# Patient Record
Sex: Male | Born: 1960 | Race: White | Hispanic: No | Marital: Married | Smoking: Current some day smoker
Health system: Southern US, Community
[De-identification: ages and names within clinical notes are randomized; demographics above are authoritative.]

## PROBLEM LIST (undated history)

## (undated) DIAGNOSIS — E785 Hyperlipidemia, unspecified: Secondary | ICD-10-CM

## (undated) DIAGNOSIS — R001 Bradycardia, unspecified: Secondary | ICD-10-CM

## (undated) DIAGNOSIS — G8929 Other chronic pain: Secondary | ICD-10-CM

## (undated) DIAGNOSIS — J449 Chronic obstructive pulmonary disease, unspecified: Secondary | ICD-10-CM

## (undated) DIAGNOSIS — R0602 Shortness of breath: Secondary | ICD-10-CM

## (undated) DIAGNOSIS — I10 Essential (primary) hypertension: Secondary | ICD-10-CM

## (undated) DIAGNOSIS — I498 Other specified cardiac arrhythmias: Secondary | ICD-10-CM

## (undated) DIAGNOSIS — R011 Cardiac murmur, unspecified: Secondary | ICD-10-CM

## (undated) DIAGNOSIS — M1990 Unspecified osteoarthritis, unspecified site: Secondary | ICD-10-CM

## (undated) DIAGNOSIS — F419 Anxiety disorder, unspecified: Secondary | ICD-10-CM

## (undated) DIAGNOSIS — M545 Low back pain: Secondary | ICD-10-CM

## (undated) DIAGNOSIS — I2510 Atherosclerotic heart disease of native coronary artery without angina pectoris: Secondary | ICD-10-CM

## (undated) DIAGNOSIS — K255 Chronic or unspecified gastric ulcer with perforation: Secondary | ICD-10-CM

## (undated) DIAGNOSIS — F329 Major depressive disorder, single episode, unspecified: Secondary | ICD-10-CM

## (undated) HISTORY — DX: Chronic obstructive pulmonary disease, unspecified: J44.9

## (undated) HISTORY — DX: Other specified cardiac arrhythmias: I49.8

## (undated) HISTORY — PX: HERNIA REPAIR: SHX51

## (undated) HISTORY — DX: Low back pain: M54.5

## (undated) HISTORY — DX: Other chronic pain: G89.29

## (undated) HISTORY — DX: Bradycardia, unspecified: R00.1

## (undated) HISTORY — DX: Shortness of breath: R06.02

## (undated) HISTORY — DX: Atherosclerotic heart disease of native coronary artery without angina pectoris: I25.10

## (undated) HISTORY — DX: Major depressive disorder, single episode, unspecified: F32.9

## (undated) HISTORY — DX: Hyperlipidemia, unspecified: E78.5

## (undated) HISTORY — DX: Anxiety disorder, unspecified: F41.9

## (undated) HISTORY — PX: VARICOSE VEIN SURGERY: SHX832

## (undated) HISTORY — DX: Essential (primary) hypertension: I10

## (undated) HISTORY — PX: KNEE SURGERY: SHX244

## (undated) HISTORY — DX: Cardiac murmur, unspecified: R01.1

## (undated) HISTORY — DX: Unspecified osteoarthritis, unspecified site: M19.90

## (undated) HISTORY — DX: Chronic or unspecified gastric ulcer with perforation: K25.5

## (undated) HISTORY — PX: TONSILLECTOMY: SUR1361

---

## 1999-01-27 ENCOUNTER — Encounter: Payer: Self-pay | Admitting: Orthopedic Surgery

## 1999-01-27 ENCOUNTER — Ambulatory Visit (HOSPITAL_COMMUNITY): Admission: RE | Admit: 1999-01-27 | Discharge: 1999-01-27 | Payer: Self-pay | Admitting: Orthopedic Surgery

## 1999-02-08 ENCOUNTER — Encounter: Admission: RE | Admit: 1999-02-08 | Discharge: 1999-02-08 | Payer: Self-pay | Admitting: Orthopedic Surgery

## 1999-02-08 ENCOUNTER — Encounter: Payer: Self-pay | Admitting: Orthopedic Surgery

## 1999-02-09 ENCOUNTER — Ambulatory Visit (HOSPITAL_BASED_OUTPATIENT_CLINIC_OR_DEPARTMENT_OTHER): Admission: RE | Admit: 1999-02-09 | Discharge: 1999-02-10 | Payer: Self-pay | Admitting: Internal Medicine

## 2000-03-11 DIAGNOSIS — K255 Chronic or unspecified gastric ulcer with perforation: Secondary | ICD-10-CM

## 2000-03-11 HISTORY — DX: Chronic or unspecified gastric ulcer with perforation: K25.5

## 2008-06-09 DIAGNOSIS — I2510 Atherosclerotic heart disease of native coronary artery without angina pectoris: Secondary | ICD-10-CM

## 2008-06-09 HISTORY — DX: Atherosclerotic heart disease of native coronary artery without angina pectoris: I25.10

## 2008-06-09 HISTORY — PX: CARDIAC CATHETERIZATION: SHX172

## 2009-12-04 ENCOUNTER — Ambulatory Visit: Payer: Self-pay | Admitting: Cardiovascular Disease

## 2009-12-06 ENCOUNTER — Telehealth: Payer: Self-pay | Admitting: Cardiovascular Disease

## 2009-12-08 ENCOUNTER — Telehealth: Payer: Self-pay | Admitting: Cardiovascular Disease

## 2009-12-11 ENCOUNTER — Encounter: Payer: Self-pay | Admitting: Cardiovascular Disease

## 2009-12-13 ENCOUNTER — Telehealth: Payer: Self-pay | Admitting: Cardiovascular Disease

## 2009-12-22 ENCOUNTER — Ambulatory Visit: Payer: Self-pay | Admitting: Cardiovascular Disease

## 2010-03-26 ENCOUNTER — Ambulatory Visit: Admit: 2010-03-26 | Payer: Self-pay | Admitting: Cardiovascular Disease

## 2010-04-09 ENCOUNTER — Ambulatory Visit
Admission: RE | Admit: 2010-04-09 | Discharge: 2010-04-09 | Payer: Self-pay | Source: Home / Self Care | Attending: Cardiovascular Disease | Admitting: Cardiovascular Disease

## 2010-04-10 NOTE — Procedures (Signed)
Summary: summary report  summary report   Imported By: Mirna Mires 12/13/2009 12:38:12  _____________________________________________________________________  External Attachment:    Type:   Image     Comment:   External Document

## 2010-04-10 NOTE — Progress Notes (Signed)
  Phone Note From Other Clinic   Caller: [facility] Summary of Call: CALL FROM [facility] WITH [place] [facility]/ PATIENT AT [facility] FOR GEST/ECHO TODAY.  UNABLE TO STRESS PATIENT-HR IN THE 40'S AND PATIENT C/O BACK PAIN(CHRONIC).  SPOKE WITH DR. Kirke Corin WHO ADVISED JUST TO DO ECHO.  PATIENT DOES NOT NEED NUCLEAR STUDY.   Initial call taken by: Dessie Coma  LPN,  December 06, 2009 10:25 AM

## 2010-04-10 NOTE — Progress Notes (Signed)
  Phone Note Outgoing Call   Call placed by: Dessie Coma  LPN,  December 13, 2009 9:57 AM Call placed to: Patient Summary of Call: Patient notified of results of monitor per Dr Kirke Corin.  Patient has f/u appt on Friday 12/22/09.  Advised to keep f/u.

## 2010-04-10 NOTE — Progress Notes (Signed)
  Phone Note Outgoing Call   Call placed by: Dessie Coma  LPN,  December 08, 2009 1:41 PM Call placed to: Patient Summary of Call: Patient notified per Dr. Kirke Corin, Echo was OK.  Patient has f/u on 12/12/09.

## 2010-04-16 ENCOUNTER — Encounter: Payer: Self-pay | Admitting: Physician Assistant

## 2010-04-23 DIAGNOSIS — R635 Abnormal weight gain: Secondary | ICD-10-CM | POA: Insufficient documentation

## 2010-04-23 DIAGNOSIS — I498 Other specified cardiac arrhythmias: Secondary | ICD-10-CM | POA: Insufficient documentation

## 2010-04-23 DIAGNOSIS — I2510 Atherosclerotic heart disease of native coronary artery without angina pectoris: Secondary | ICD-10-CM | POA: Insufficient documentation

## 2010-04-23 DIAGNOSIS — R079 Chest pain, unspecified: Secondary | ICD-10-CM

## 2010-04-23 DIAGNOSIS — J449 Chronic obstructive pulmonary disease, unspecified: Secondary | ICD-10-CM

## 2010-04-23 DIAGNOSIS — J4489 Other specified chronic obstructive pulmonary disease: Secondary | ICD-10-CM | POA: Insufficient documentation

## 2010-04-23 DIAGNOSIS — I499 Cardiac arrhythmia, unspecified: Secondary | ICD-10-CM

## 2010-04-23 DIAGNOSIS — I10 Essential (primary) hypertension: Secondary | ICD-10-CM

## 2010-04-23 DIAGNOSIS — Z87891 Personal history of nicotine dependence: Secondary | ICD-10-CM

## 2010-04-23 HISTORY — DX: Other specified chronic obstructive pulmonary disease: J44.89

## 2010-04-23 HISTORY — DX: Other specified cardiac arrhythmias: I49.8

## 2010-04-23 HISTORY — DX: Chronic obstructive pulmonary disease, unspecified: J44.9

## 2010-04-23 HISTORY — DX: Atherosclerotic heart disease of native coronary artery without angina pectoris: I25.10

## 2010-04-23 HISTORY — DX: Essential (primary) hypertension: I10

## 2010-04-23 HISTORY — DX: Personal history of nicotine dependence: Z87.891

## 2010-04-23 HISTORY — DX: Cardiac arrhythmia, unspecified: I49.9

## 2010-04-23 HISTORY — DX: Chest pain, unspecified: R07.9

## 2010-04-23 NOTE — Assessment & Plan Note (Signed)
[facility]                       [place] [facility]ARDIOLOGY OFFICE NOTE  ISAY, PERLEBERG                        MRN:          [id number] DATE:04/09/2010                            DOB:          [birth year]   [name] is a 50 year old gentleman who is here today for followup visit.  He has the following problem list: 1. COPD. 2. Chronic bradycardia. 3. Nonobstructive coronary artery disease per cardiac catheterization     in April 2010, which was done at [facility] by Dr. Warnell Forester.  There     was only 30% mid LAD stenosis at that time with normal ejection     fraction and left ventricular end-diastolic pressure. 4. Tobacco use, quit recently.  INTERVAL HISTORY:  [name] is still complaining of exertional chest pain, dyspnea and decreased exercise capacity.  He still describes chest tightness that happens with activities as well as dyspnea.  He does feel dizzy when he changes positions especially to standing up.  There has been no syncope.  He is not having as much palpitations as before.  MEDICATIONS: 1. Albuterol inhaler as needed. 2. Seroquel 50 mg 3 tablets at bedtime. 3. Lexapro 20 mg once daily. 4. Benazepril/hydrochlorothiazide 20/12.5 mg once daily. 5. Tamsulosin 0.4 mg once daily. 6. Omeprazole 20 mg twice daily. 7. Methadone 10 mg 4 tablets twice daily. 8. Clonazepam 0.5 mg twice daily. 9. Mirtazapine 15 mg once daily.  PHYSICAL EXAMINATION:  VITAL SIGNS:  Weight is 232.4 pounds, blood pressure is 99/62, pulse is 41, oxygen saturation is 96% on room air. NECK:  No JVD or carotid bruits. LUNGS:  Clear to auscultation. HEART:  Regular rate and rhythm with no gallops or murmurs. ABDOMEN:  Benign, nontender, nondistended. EXTREMITIES:  With no clubbing, cyanosis or edema.  An electrocardiogram was performed which showed sinus bradycardia with heart rate of 42 with nonspecific ST-T wave changes.  ASSESSMENT AND PLAN: 1. Exertional  chest pain and dyspnea:  The etiology of this is still     not clear.  He did have an echocardiogram done which showed normal     LV systolic function with only mild mitral regurgitation.  He had     cardiac catheterization done in 2010, which showed no evidence of     obstructive coronary artery disease.  He is still significantly     bradycardic.  This might be contributing to his symptoms.  Due to     that, I recommend proceeding with a treadmill stress test for     further evaluation.  The test may lead to evaluate his heart rate     response to exercise and see if he has evidence of chronotropic     incompetence.  If he does have that, then he might benefit from     placement of pacemaker.  This was discussed extensively with the     patient.  His thyroid function was checked and it was normal.  I do     not see anything in his medications that can cause significant     bradycardia. 2. Weight gain:  This seems to  be due to his recent smoking cessation.     I advised the patient to start an exercise program to reverse the     effect of that.  The patient will be notified with the results of     the treadmill stress test.  If it is significantly abnormal, we     will likely refer him to electrophysiology for evaluation of     pacemaker placement.    Lorine Bears, MD Electronically Signed   MA/MedQ  DD: 04/09/2010  DT: 04/10/2010  Job #: [id number]

## 2010-04-26 ENCOUNTER — Encounter: Payer: Self-pay | Admitting: Physician Assistant

## 2010-04-26 ENCOUNTER — Encounter (INDEPENDENT_AMBULATORY_CARE_PROVIDER_SITE_OTHER): Payer: Medicaid Other | Admitting: Physician Assistant

## 2010-04-26 DIAGNOSIS — R079 Chest pain, unspecified: Secondary | ICD-10-CM

## 2010-05-07 ENCOUNTER — Telehealth (INDEPENDENT_AMBULATORY_CARE_PROVIDER_SITE_OTHER): Payer: Self-pay | Admitting: Radiology

## 2010-05-08 ENCOUNTER — Ambulatory Visit (HOSPITAL_COMMUNITY): Payer: Medicaid Other

## 2010-05-08 DIAGNOSIS — R0989 Other specified symptoms and signs involving the circulatory and respiratory systems: Secondary | ICD-10-CM

## 2010-05-09 ENCOUNTER — Encounter: Payer: Self-pay | Admitting: Cardiology

## 2010-05-09 ENCOUNTER — Ambulatory Visit (HOSPITAL_COMMUNITY): Payer: Medicaid Other | Attending: Dermatopathology

## 2010-05-09 DIAGNOSIS — R079 Chest pain, unspecified: Secondary | ICD-10-CM | POA: Insufficient documentation

## 2010-05-09 DIAGNOSIS — I2510 Atherosclerotic heart disease of native coronary artery without angina pectoris: Secondary | ICD-10-CM

## 2010-05-09 DIAGNOSIS — R0989 Other specified symptoms and signs involving the circulatory and respiratory systems: Secondary | ICD-10-CM

## 2010-05-09 DIAGNOSIS — R0609 Other forms of dyspnea: Secondary | ICD-10-CM

## 2010-05-09 DIAGNOSIS — R0789 Other chest pain: Secondary | ICD-10-CM

## 2010-05-10 ENCOUNTER — Telehealth (INDEPENDENT_AMBULATORY_CARE_PROVIDER_SITE_OTHER): Payer: Self-pay | Admitting: *Deleted

## 2010-05-10 ENCOUNTER — Ambulatory Visit (INDEPENDENT_AMBULATORY_CARE_PROVIDER_SITE_OTHER): Payer: Medicaid Other | Admitting: Cardiovascular Disease

## 2010-05-10 ENCOUNTER — Ambulatory Visit: Payer: Medicaid Other | Admitting: Cardiovascular Disease

## 2010-05-10 DIAGNOSIS — R9430 Abnormal result of cardiovascular function study, unspecified: Secondary | ICD-10-CM

## 2010-05-10 DIAGNOSIS — I498 Other specified cardiac arrhythmias: Secondary | ICD-10-CM

## 2010-05-10 DIAGNOSIS — I209 Angina pectoris, unspecified: Secondary | ICD-10-CM

## 2010-05-10 HISTORY — PX: CARDIAC CATHETERIZATION: SHX172

## 2010-05-10 HISTORY — PX: CORONARY ANGIOPLASTY WITH STENT PLACEMENT: SHX49

## 2010-05-16 ENCOUNTER — Observation Stay (HOSPITAL_COMMUNITY)
Admission: RE | Admit: 2010-05-16 | Discharge: 2010-05-17 | Disposition: A | Discharge status: Home / Self Care | Payer: Medicaid Other | Source: Ambulatory Visit | Attending: Cardiovascular Disease | Admitting: Cardiovascular Disease

## 2010-05-16 DIAGNOSIS — Z79899 Other long term (current) drug therapy: Secondary | ICD-10-CM | POA: Insufficient documentation

## 2010-05-16 DIAGNOSIS — Z87891 Personal history of nicotine dependence: Secondary | ICD-10-CM | POA: Insufficient documentation

## 2010-05-16 DIAGNOSIS — Z23 Encounter for immunization: Secondary | ICD-10-CM | POA: Insufficient documentation

## 2010-05-16 DIAGNOSIS — I2510 Atherosclerotic heart disease of native coronary artery without angina pectoris: Principal | ICD-10-CM | POA: Insufficient documentation

## 2010-05-16 DIAGNOSIS — Z8711 Personal history of peptic ulcer disease: Secondary | ICD-10-CM | POA: Insufficient documentation

## 2010-05-16 DIAGNOSIS — I498 Other specified cardiac arrhythmias: Secondary | ICD-10-CM | POA: Insufficient documentation

## 2010-05-16 DIAGNOSIS — J4489 Other specified chronic obstructive pulmonary disease: Secondary | ICD-10-CM | POA: Insufficient documentation

## 2010-05-16 DIAGNOSIS — Z7982 Long term (current) use of aspirin: Secondary | ICD-10-CM | POA: Insufficient documentation

## 2010-05-16 DIAGNOSIS — J449 Chronic obstructive pulmonary disease, unspecified: Secondary | ICD-10-CM | POA: Insufficient documentation

## 2010-05-16 LAB — BASIC METABOLIC PANEL
BUN: 16 mg/dL (ref 6–23)
BUN: 16 mg/dL (ref 6–23)
CO2: 30 mEq/L (ref 19–32)
CO2: 26 mEq/L (ref 19–32)
Calcium: 9.1 mg/dL (ref 8.4–10.5)
Calcium: 8.6 mg/dL (ref 8.4–10.5)
Chloride: 98 mEq/L (ref 96–112)
Chloride: 107 mEq/L (ref 96–112)
Creatinine, Ser: 1.27 mg/dL (ref 0.4–1.5)
Creatinine, Ser: 1.29 mg/dL (ref 0.4–1.5)
GFR calc Af Amer: >60 mL/min (ref >60)
GFR calc Af Amer: >60 mL/min (ref >60)
GFR calc non Af Amer: >60 mL/min (ref >60)
GFR calc non Af Amer: 59 mL/min — ABNORMAL LOW (ref >60)
Glucose, Bld: 90 mg/dL (ref 70–99)
Glucose, Bld: 78 mg/dL (ref 70–99)
Potassium: 4.5 mEq/L (ref 3.5–5.1)
Potassium: 4.1 mEq/L (ref 3.5–5.1)
Sodium: 137 mEq/L (ref 135–145)
Sodium: 138 mEq/L (ref 135–145)

## 2010-05-16 LAB — CBC
HCT: 42.9 % (ref 39.0–52.0)
Hemoglobin: 14.7 g/dL (ref 13.0–17.0)
MCH: 31.1 pg (ref 26.0–34.0)
MCHC: 34.3 g/dL (ref 30.0–36.0)
MCV: 90.7 fL (ref 78.0–100.0)
Platelets: 131 10*3/uL — ABNORMAL LOW (ref 150–400)
RBC: 4.73 MIL/uL (ref 4.22–5.81)
RDW: 13 % (ref 11.5–15.5)
WBC: 5.8 10*3/uL (ref 4.0–10.5)

## 2010-05-16 LAB — PROTIME-INR
INR: 0.82 (ref 0.00–1.49)
Prothrombin Time: 11.5 seconds — ABNORMAL LOW (ref 11.6–15.2)

## 2010-05-16 LAB — POCT ACTIVATED CLOTTING TIME: Activated Clotting Time: 599 seconds

## 2010-05-17 LAB — CBC
HCT: 44 % (ref 39.0–52.0)
Platelets: 130 10*3/uL — ABNORMAL LOW (ref 150–400)
RBC: 4.82 MIL/uL (ref 4.22–5.81)
RDW: 13 % (ref 11.5–15.5)
WBC: 6.5 10*3/uL (ref 4.0–10.5)

## 2010-05-17 LAB — BASIC METABOLIC PANEL
Chloride: 99 mEq/L (ref 96–112)
GFR calc non Af Amer: 57 mL/min — ABNORMAL LOW (ref >60)
Glucose, Bld: 99 mg/dL (ref 70–99)
Potassium: 4.8 mEq/L (ref 3.5–5.1)
Sodium: 139 mEq/L (ref 135–145)

## 2010-05-17 NOTE — Progress Notes (Signed)
Summary: nuc pre-procedure  Phone Note Outgoing Call   Call placed by: Domenic Polite, CNMT,  May 07, 2010 2:43 PM Call placed to: Patient Reason for Call: Confirm/change Appt Summary of Call: Reviewed information on [employer] Information Sheet (see scanned document for further details).  Spoke with patient.      Nuclear Med Background Indications for Stress Test: Evaluation for Ischemia   History: COPD, Echo, GXT, Heart Catheterization  History Comments: Echo NL. mild mitral-tricuspid regurgitation; '10 Cath - [facility] 30% LAD stenosis; 04/26/10 GXT non-diagnostic  Symptoms: Chest Pain with Exertion, Chest Tightness with Exertion, Dizziness, DOE  Symptoms Comments: H/O of bradycardia   Nuclear Pre-Procedure Cardiac Risk Factors: History of Smoking, Hypertension

## 2010-05-17 NOTE — Progress Notes (Signed)
----   Converted from flag ---- ---- 05/10/2010 4:23 PM, Marilynne Halsted, CMA, AAMA wrote: pt has medicaid only. no pac rqd  ---- 05/10/2010 4:11 PM, Dessie Coma  LPN wrote: Georgiana Spinner Cath scheduled at [facility] on Wed. 05/16/10 for chest pain/abnormal stress test.  Thanks,Adar Rase ------------------------------

## 2010-05-17 NOTE — Assessment & Plan Note (Signed)
Summary: Cardiology Nuclear Testing  Nuclear Med Background Indications for Stress Test: Evaluation for Ischemia   History: Asthma, COPD, Echo, Emphysema, GXT, Heart Catheterization  History Comments: '10 Cath:n/o CAD; '11 Echo:normal LVF, mild  MR/TR; 04/26/10 QQV:[id number]  Symptoms: Chest Tightness with Exertion, Diaphoresis, Dizziness, DOE, Fatigue  Symptoms Comments: Last episode of JJ:[id number]   Nuclear Pre-Procedure Cardiac Risk Factors: History of Smoking, Hypertension, Lipids, Smoker Caffeine/Decaff Intake: none NPO After: 12:00 AM Lungs: Expiratory wheezes.  O2 Sat 98% on RA.  Albuterol inhaler used prior to infusion. IV 0.9% NS with Angio Cath: 18g     IV Site: R Antecubital IV Started by: Stanton Kidney, EMT-P Chest Size (in) 48     Height (in): 73 Weight (lb): 230 BMI: 30.45  Nuclear Med Study 1 or 2 day study:  2 day     Stress Test Type:  Eugenie Birks Reading MD:  Marca Ancona, MD     Referring MD:  Lorine Bears, MD Resting Radionuclide:  Technetium 65m Tetrofosmin     Resting Radionuclide Dose:  33.0 mCi  Stress Radionuclide:  Technetium 76m Tetrofosmin     Stress Radionuclide Dose:  32.5 mCi   Stress Protocol   Lexiscan: 0.4 mg   Stress Test Technologist:  [employer], CMA-N     Nuclear Technologist:  Domenic Polite, CNMT  Rest Procedure  Myocardial perfusion imaging was performed at rest 45 minutes following the intravenous administration of Technetium 4m Tetrofosmin.  Stress Procedure  The patient received IV Lexiscan 0.4 mg over 15-seconds.  Technetium 49m Tetrofosmin injected at 30-seconds.  There were no significant changes with infusion.  Quantitative spect images were obtained after a 45 minute delay.  QPS Raw Data Images:  Normal; no motion artifact; normal heart/lung ratio. Stress Images:  Mid anterior, mid anterolateral, mid anteroseptal, apical lateral apical anterior, and true apex perfusion defect.  Rest Images:  Normal homogeneous  uptake in all areas of the myocardium. Subtraction (SDS):  Reversible perfusion defect involving the mid anterior/anterolateral/anteroseptal, apical anterior/lateral, and true apex segments. Transient Ischemic Dilatation:  1.10  (Normal <1.22)  Lung/Heart Ratio:  .44  (Normal <0.45)  Quantitative Gated Spect Images QGS EDV:  159 ml QGS ESV:  79 ml QGS EF:  51 % QGS cine images:  Mid to apical anterior hypokinesis.    Overall Impression  Exercise Capacity: Lexiscan with no exercise. BP Response: Normal blood pressure response. Clinical Symptoms: Short of breath ECG Impression: No significant ST segment change suggestive of ischemia. Overall Impression: Moderate-sized defect with ischemia involving the mid anterior/anteroseptal/anterolateral, apical lateral/anterior, and true apex segments with mid to apical anterior hypokinesis and EF 51%.

## 2010-05-18 ENCOUNTER — Encounter: Payer: Self-pay | Admitting: Cardiovascular Disease

## 2010-05-28 ENCOUNTER — Ambulatory Visit (INDEPENDENT_AMBULATORY_CARE_PROVIDER_SITE_OTHER): Payer: Medicaid Other | Admitting: Cardiovascular Disease

## 2010-05-28 DIAGNOSIS — E7800 Pure hypercholesterolemia, unspecified: Secondary | ICD-10-CM

## 2010-05-28 DIAGNOSIS — I2510 Atherosclerotic heart disease of native coronary artery without angina pectoris: Secondary | ICD-10-CM

## 2010-05-28 DIAGNOSIS — I10 Essential (primary) hypertension: Secondary | ICD-10-CM

## 2010-06-01 NOTE — Assessment & Plan Note (Signed)
[facility]                       [place] CARDIOLOGY OFFICE NOTE  JERRID, FORGETTE                        MRN:          [id number] DATE:05/28/2010                            DOB:          1960/05/28   Mr. Gluth is a 50 year old gentleman who is here today for a followup visit.  He has the following problem list: 1. Coronary artery disease.  Cardiac catheterization done on May 16, 2010.  It showed a 95% mid LAD stenosis and 20% right coronary     artery stenosis.  Ejection fraction was normal.  He underwent an     angioplasty and drug-eluting stent placement to the mid-LAD without     complications. 2. Chronic obstructive pulmonary disease. 3. Chronic bradycardia. 4. Previous tobacco use. 5. History of perforated gastric ulcer in 2002.  INTERIM HISTORY:  Mr. Schwieger underwent cardiac catheterization which confirmed the presence of high-grade stenosis in the mid left anterior descending artery.  He underwent an angioplasty and drug-eluting stent placement without complications.  His chest pain has resolved completely.  He continues to have exertional dyspnea.  His bradycardia has actually improved.  MEDICATIONS: 1. Albuterol inhaler as needed. 2. Seroquel 50 mg 3 tablets at bedtime. 3. Lexapro 20 mg once daily. 4. Benazepril/hydrochlorothiazide 20/12.5 mg once daily. 5. Tamsulosin 0.4 mg once daily. 6. Methadone 10 mg 4 tablets twice daily. 7. Clonazepam 0.5 mg twice daily. 8. Atorvastatin 80 mg at bedtime. 9. Aspirin 325 mg once daily. 10.Plavix 75 mg once daily.  PHYSICAL EXAMINATION:  VITAL SIGNS:  Weight is 232.6 pounds, blood pressure is 119/79, pulse is 57, oxygen saturation is 94% on room air. NECK:  No JVD or carotid bruits. LUNGS:  Clear to auscultation. HEART:  Regular rate and rhythm with no gallops or murmurs. ABDOMEN:  Benign, nontender, nondistended. EXTREMITIES:  With no clubbing, cyanosis, or edema.  Right radial  area is still very tender to touch.  The pulse in the right radial artery is slightly reduced.  IMPRESSION: 1. Coronary artery disease status post angioplasty and drug-eluting     stent placement to high grade mid LAD stenosis.  His anginal     symptoms have resolved completely.  He does have chronic exertional     dyspnea, which I suspect is due to his COPD and physical     deconditioning.  At this time, we will continue with treatment with     aspirin and Plavix daily.  His dose of aspirin will be decreased to     81 mg in few months.  He will be referred to cardiac rehab.  We     will avoid beta-blockers due to his history of chronic bradycardia. 2. Hyperlipidemia:  He was started on high-dose atorvastatin.  We will     have a followup lipid and liver profile in 1 month from now. 3. Hypertension:  Blood pressure is well controlled with     benazepril/hydrochlorothiazide.  The patient will follow up in 2     months from now or earlier if needed.    Lorine Bears, MD Electronically Signed  MA/MedQ  DD: 05/28/2010  DT: 05/29/2010  Job #: [id number]

## 2010-06-01 NOTE — Assessment & Plan Note (Signed)
[facility]                       [place] CARDIOLOGY OFFICE NOTE  KLEVER, TWYFORD                        MRN:          [id number] DATE:05/10/2010                            DOB:          1960/08/03   [name] is a 50 year old gentleman who is here today for a followup visit after recent stress test.  He has the following problem list: 1. Nonobstructive coronary artery disease per cardiac catheterization     in April 2010, which was done at [facility].  It showed 30% mid LAD     stenosis with normal ejection fraction. 2. Chronic obstructive pulmonary disease. 3. Chronic bradycardia. 4. History of tobacco use, quit few months ago. 5. History of perforated gastric ulcer in 2002.  INTERVAL HISTORY:  [name] is being followed for exertional chest pain and dyspnea along with decreased exercise capacity and bradycardia. During his last visit, I scheduled him for a treadmill stress test to evaluate his heart rate response to exercise.  During the stress test, the patient started having chest pain after only 2 minutes and could not continue.  We were not able to get his heart rate up.  Thus he was scheduled for a Lexiscan nuclear stress test, which showed evidence of anterior ischemia.  The patient continues to have chest pain and tightness with minimal activities associated with shortness of breath. He has no syncope or presyncope.  MEDICATIONS: 1. Albuterol inhaler 2 puffs as needed. 2. Seroquel 50 mg 2-3 tablets at bedtime. 3. Lexapro 20 mg once daily. 4. Benazepril/hydrochlorothiazide 20/12.5 mg once daily. 5. Tamsulosin 0.4 mg once daily. 6. Omeprazole 20 mg once daily. 7. Methadone 10 mg 4 tablets twice daily. 8. Clonazepam 0.5 mg twice daily. 9. Mirtazapine 15 mg once daily.  ALLERGIES:  CODEINE, PENICILLIN, and IBUPROFEN.  PHYSICAL EXAMINATION:  VITAL SIGNS:  Weight is 229 pounds, blood pressure is 123/83, pulse is 55, oxygen saturation  is 95% on room air. NECK:  No JVD or carotid bruits. LUNGS:  Clear to auscultation. HEART:  Regular rate and rhythm with no gallops or murmurs. ABDOMEN:  Benign, nontender, and nondistended. EXTREMITIES:  No clubbing, cyanosis, or edema.  IMPRESSION: 1. Exertional chest pain and dyspnea:  This is suggestive of class III     angina.  He underwent Lexiscan nuclear stress test, which showed     moderate-sized ischemia in the LAD distribution with an ejection     fraction of 51%.  This correlates with his previous lesion in the     mid-LAD that was described in his cardiac catheterization in 2010.     Due to all of that, I recommend proceeding with cardiac     catheterization and possible coronary intervention.  Risks,     benefits, and alternatives were discussed with the patient.  In the     meanwhile, I asked him to start taking aspirin 81 mg once daily. 2. Chronic sinus bradycardia:  We will reevaluate the patient's     symptoms after cardiac catheterization.  A pacemaker can be     considered if he continues to complain of generalized fatigue with  exercise intolerance if no coronary artery disease is found on him.     The patient will follow up in 1 week after cardiac catheterization.    Ryan Bears, MD Electronically Signed   MA/MedQ  DD: 05/10/2010  DT: 05/11/2010  Job #: [id number]

## 2010-06-05 ENCOUNTER — Telehealth: Payer: Self-pay | Admitting: Cardiovascular Disease

## 2010-06-05 NOTE — Telephone Encounter (Signed)
I called Mr.Ezzell and advised that the dental paperwork was faxed to the [facility] office and to follow up with Victorino Dike T tomorrow. He verbalized understanding.

## 2010-06-06 NOTE — Discharge Summary (Signed)
NAMEGILLIS, BOARDLEY                 ACCOUNT NO.:  [id number]  MEDICAL RECORD NO.:  [id number]           PATIENT TYPE:  I  LOCATION:  6527                         FACILITY:  [facility]  PHYSICIAN:  Brenlynn Fake C. Eden Emms, MD, FACCDATE OF BIRTH:  09-04-1960  DATE OF ADMISSION:  05/16/2010 DATE OF DISCHARGE:  05/17/2010                              DISCHARGE SUMMARY   PRIMARY CARDIOLOGIST:  Lorine Bears, MD  PRIMARY CARE PHYSICIAN:  Brent Bulla, MD ([place])  DISCHARGE DIAGNOSES: 1. Coronary artery disease.     a.     Cardiac catheterization May 16, 2010:  Severe single-vessel      CAD, 95% mid LAD stenosis.  Normal left ventricular systolic      function with mild-to-moderate elevated left ventricular end-      diastolic pressure, left ventricular ejection fraction 55% with no      wall motion abnormalities.     b.     Successful angioplasty and drug-eluting stent placement to      left anterior descending with 3.0 x 24-mm Promus Element.      Residual 0% stenosis with TIMI 3 flow..  SECONDARY DIAGNOSES: 1. Remote tobacco abuse (previously 2 ppd, quit 8 months ago).     a.     Tobacco cessation consult completed with support strategies      to maintain tobacco cessation as well as information for tobacco      cessation for the patient's wife. 2. Chronic obstructive pulmonary disease. 3. Chronic bradycardia. 4. History of perforated gastric ulcer, 2002.  ALLERGIES AND INTOLERANCES: 1. CODEINE (rash). 2. PENICILLINS (rash).  PROCEDURES: 1. EKG May 16, 2010:  Marked sinus bradycardia, 39 bpm, nonspecific     ST - T-wave changes, no significant Q-waves, left axis deviation     without evidence of hypertrophy, PR 162, QRS 92, and QTC of 410. 2. Cardiac catheterization May 16, 2010:  Please see discharge     diagnosis #1, subsection A. 3. EKG May 17, 2010:  No significant change from prior tracing.  HISTORY OF PRESENT ILLNESS:  Mr. Villwock is a 50 year old Caucasian gentleman  who had a diagnosis of nonobstructive coronary artery disease per cath in April 2010 prior to being seen in the office recently by Dr. Kirke Corin at [facility] location.  The patient complained of symptoms consistent with angina and underwent treadmill stress testing, but unfortunately was unable to reach target heart rate secondary to chest discomfort after only 2 minutes.  He then had a Lexiscan nuclear study, which showed evidence of anterior ischemia.  He was subsequently set up for diagnostic cardiac catheterization and presented for that procedure.  HOSPITAL COURSE:  The patient underwent procedure as described above via right radial approach and only significant disease was in the mid LAD as described above.  He did have 40% proximal RCA stenosis, but normal LV function without significant wall motion abnormality, slightly elevated LV end-diastolic pressure.  The patient received drug-eluting stent as described above and was kept overnight for observation.  Of note, the patient had oozing after several attempts at removing his right radial TR band,  but eventually TR band was able to be removed with no further oozing nor any need for intervention/medical therapy.  On the morning of May 17, 2010, the patient was seen by attending cardiologist, Dr. Charlton Haws and deemed stable for discharge.  He will be discharged with dual antiplatelet therapy and statin therapy as outlined in discharge med section.  DAPT should continue for greater than or equal to 1 year given his PCI with DES.  The patient was also given his followup instructions and post-cath instructions.  All questions and concerns were addressed prior to leaving the hospital.  Note, also the patient worked with cardiac rehab phase I prior to being discharged from the hospital.  DISCHARGE LABS:  WBC is 6.5, HGB 14.9, HCT 44.0, PLT count is 130.  Pro- time 11.5, INR 0.82.  Sodium 139, potassium 4.8, chloride 99,  bicarb 32, BUN 14, creatinine 1.33, glucose 99, calcium 9.3.  FOLLOWUP PLANS AND APPOINTMENTS:  Dr. Kirke Corin June 11, 2010, at 10:45 a.m. [facility] office.  DISCHARGE MEDICATIONS: 1. Atorvastatin 80 mg 1 tablet p.o. nightly. 2. Acetaminophen 325 mg 1-2 tabs p.o. q.4 h. p.r.n. 3. Enteric-coated aspirin 325 mg p.o. daily. 4. Clopidogrel 75 mg p.o. daily with meals. 5. Sublingual nitroglycerin 0.4 mg q.5 minutes up to 3 doses p.r.n.     for chest discomfort. 6. Albuterol inhaler 1-2 puffs inhaled q.4 h. P.r.n. 7. Alprazolam 0.5 mg 1 tablet p.o. t.i.d. p.r.n. 8. Benazepril/HCTZ 1 tablet p.o. daily. 9. Lexapro 20 mg 1 tablet p.o. daily. 10.Methadone 10 mg 4 tablets p.o. b.i.d. 11.Mirtazapine 15 mg p.o. nightly. 12.Mometasone inhaler/Dulera 2 puffs inhaled b.i.d. 13.Omeprazole 20 mg 1 tablet p.o. daily. 14.Seroquel 50 mg 1 tablet p.o. nightly p.r.n. for insomnia.  DURATION OF DISCHARGE ENCOUNTER:  Including physician time was 35 minutes.     Jarrett Ables, PAC   ______________________________ Noralyn Pick. Eden Emms, MD, [facility]   MS/MEDQ  D:  05/17/2010  T:  05/18/2010  Job:  [id number]  cc:   Lorine Bears, MD Brent Bulla, MD  Electronically Signed by Jarrett Ables PAC on 05/29/2010 02:52:09 PM Electronically Signed by Charlton Haws MD [facility] on 06/06/2010 11:10:19 PM

## 2010-06-08 NOTE — Procedures (Signed)
NAMEBARUC, TUGWELL                 ACCOUNT NO.:  [id number]  MEDICAL RECORD NO.:  [id number]           PATIENT TYPE:  I  LOCATION:  6527                         FACILITY:  [facility]  PHYSICIAN:  Lorine Bears, MD     DATE OF BIRTH:  31-Mar-1960  DATE OF PROCEDURE: DATE OF DISCHARGE:                           CARDIAC CATHETERIZATION   PRIMARY CARE PHYSICIAN:  Dr. Brent Bulla in [place].  PROCEDURES PERFORMED: 1. Left heart catheterization 2. Coronary angiography. 3. Left ventricular angiography. 4. Left anterior descending artery angioplasty and a drug-eluting     stent placement to the mid segment for a 95% stenosis.  INDICATIONS AND CLINICAL HISTORY:  Ryan Quinn is a 50 year old gentleman who presented for evaluation of chest pain and bradycardia.  The patient missed few appointments for a stress test, but ultimately he did have treadmill stress test but could not achieve target heart rate due to chest pain.  He was switched to a Lexiscan nuclear stress test which showed a large area of ischemia in the anterior wall.  Due to his limiting symptoms and significantly abnormal stress test, cardiac catheterization was recommended.  Risks, benefits and alternatives were discussed with the patient.  Access is right radial artery with a 5-French sheath.  STUDY DETAILS:  A standard informed consent was obtained.  He was given fentanyl and Versed for sedation.  The right radial artery was prepped in a sterile fashion.  It was anesthetized with 1% lidocaine.  A 5- French sheath was placed in the right radial artery after an anterior puncture, 3 mg of verapamil was given through the sheath, 5000 units of unfractioned heparin was given intravenously.  Coronary angiography was performed with a TIG catheter.  Left ventricular angiography was performed with a pigtail catheter.  All catheter exchanges were done over the wire.  After the diagnostic part, I proceeded with interventional  procedure on the left anterior descending artery.  INTERVENTIONAL PROCEDURE NOTE:  The 5-French sheath was kept in place. IV bivalirudin was initiated with therapeutic ACT.  He was given aspirin 325 and Plavix 600 mg.  An EBU 3.0 guiding catheter was used to intubate the left main coronary artery.  The lesion was crossed with an intuition wire.  It was predilated with 2.5 x 12 Apex balloon to 4 atmospheres and then 8 atmospheres.  I then placed a 3.0 x 24-mm Promus Element stent and deployed it at 16 atmospheres.  This was postdilated in the mid and proximal area with an Volin Quantum 3.5 x 20-mm balloon to 14 atmospheres. Angiography showed excellent results.  The wire and guiding catheter were removed.  Sheath was removed and a TR band was applied.  There were no immediate complications.  STUDY FINDINGS:  Hemodynamic findings: Left ventricular pressure was 133/13 with a left ventricular end- diastolic pressure of 25 mmHg.  Aortic pressure was 140/73 with a mean pressure of 98 mmHg.  No significant gradient across the aortic valve. Left ventricular angiography:  This showed an ejection fraction of 55% with no wall motion abnormalities.  CORONARY ANGIOGRAPHY:  Left main coronary artery:  The vessel was normal  in size with a 10-20% ostial stenosis. Left circumflex artery:  The vessel was overall medium in size and free of significant disease.  OM-1 is absent and is supplied by the ramus. OM-II and III are medium-sized branches and free of significant disease. Ramus artery:  The vessel was normal in size and free of significant disease. Left anterior descending artery:  The vessel was normal in size.  There is a tubular 95% stenosis in the mid segment.  The rest of the LAD is free of significant disease.  Second and third diagonals are medium size branches. Right coronary artery:  The vessel was normal in size and dominant. There is 40% tubular proximal stenosis.  The rest of the vessel  is free of significant disease.  The PDA is a normal-size vessel and free of significant disease.  There are 2 small-sized posterolateral branches which are free of significant disease.  STUDY CONCLUSIONS: 1. Severe single-vessel coronary artery disease. 2. Normal LV systolic function with mildly to moderately elevated left     ventricular end-diastolic pressure. 3. Successful angioplasty and a drug-eluting stent placement to a 95%     mid LAD stenosis.  This resulted in a 0% residual with TIMI 3 flow.     A 3.0 x 24-mm Promus drug-eluting stent was placed.  RECOMMENDATIONS:  Recommend aspirin daily indefinitely as well as Plavix 75 mg once daily for at least 12 months.  We will initiate treatment with a statin as well.     Lorine Bears, MD     MA/MEDQ  D:  05/16/2010  T:  05/16/2010  Job:  [id number]  Electronically Signed by Lorine Bears MD on 06/08/2010 12:01:18 AM

## 2010-06-11 ENCOUNTER — Ambulatory Visit: Payer: Medicaid Other | Admitting: Cardiovascular Disease

## 2010-07-24 NOTE — Assessment & Plan Note (Signed)
[facility]                        [facility] CARDIOLOGY OFFICE NOTE   Ryan Quinn, Ryan Quinn                        MRN:          [id number]  DATE:12/22/2009                            DOB:          [birth year]    Mr. Hearne is a 50 year old gentleman who is here today for a followup  visit.  He has the following problem list:  1. Hypertension.  2. COPD.  3. Chronic bradycardia.  4. Nonobstructive coronary artery disease per cardiac catheterization      in April 2010 done at [facility].  5. Tobacco use, quit recently.   INTERVAL HISTORY:  I evaluated Mr. Dewald recently for atypical chest  pain and exertional dyspnea.  He was noted to be bradycardic.  I  requested an echocardiogram as well as a 48-hour Holter monitor and a  treadmill stress test to evaluate his heart rate response to exercise.  These tests were done except for the stress test, which was not done due  to concern about his resting bradycardia.  The patient continues to have  dyspnea.  He actually complains of palpitations with heart rate  sometimes in the 90s and low 100.  This seems to be bothering him more  than the actual bradycardia.  He had the bradycardia for many years.  He  never had any syncope or presyncope.  Occasionally, he gets slightly  dizzy and lightheaded.   PHYSICAL EXAMINATION:  VITAL SIGNS:  Weight is 203 pounds, blood  pressure is 133/90, pulse is 89, oxygen saturation is 97% on room air.  HEENT:  Normocephalic, atraumatic.  NECK:  No JVD or carotid bruits.  LUNGS:  Clear to auscultation.  HEART:  Regular rate and rhythm with no gallops or murmurs.  ABDOMEN:  Benign, nontender, nondistended.  EXTREMITIES:  With no clubbing, cyanosis, or edema.   IMPRESSION:  1. Atypical chest pain and dyspnea:  He did have cardiac      catheterization in 2010, which showed no significant coronary      artery disease.  Thus, I do not think this is due to ischemic heart      disease.   His dyspnea is likely multifactorial due to his smoking      history as well as some deconditioning.  His echocardiogram showed      normal LV systolic function with mild mitral and tricuspid      regurgitation without evidence of pulmonary hypertension.      Obviously, these are not enough to explain his symptoms.  My only      concern is if the bradycardia is contributing to his symptoms,      which does not seem to be the case.  He is able to exercise without      having significant dizziness or presyncopal episodes.  Thus, at      this time, I will elect to just observe him.  I advised him to      start an aerobic exercise program.  He also describes symptoms of      poor quality sleep, snoring, and feeling tired during the day.  It      might be worth evaluating him for a sleep apnea, which might be      contributing to his bradycardia at night.  2. Sinus bradycardia:  His Holter monitor showed episodes of sinus      bradycardia with the lowest heart rate of 35 beats per minute at 6      in the morning, likely when he was asleep.  There was only few      PACs.  I checked his thyroid function.  His TSH was low at 0.054.      However, his T3 and T4 came back within normal range.  The patient      seems to have alternating bradycardia with episodes of tachycardia.      I do not think his symptoms are bad enough at this time to require      pacemaker.  Obviously in the future, this might be needed if      symptoms get worse.  At this time, I will have him come back and      see me in 3 months to reevaluate his symptoms.     Lorine Bears, MD  Electronically Signed    MA/MedQ  DD: 12/22/2009  DT: 12/23/2009  Job #: [id number]

## 2010-07-24 NOTE — Letter (Signed)
December 04, 2009    Dr. Brent Bulla   RE:  Ryan Quinn, Ryan Quinn  MRN:  [id number]  /  DOB:  January 27, 1961   RE:  [name]   Dear Dr. Marina Goodell:   Thank you for referring Ryan Quinn for further cardiac evaluation.  As  you are aware, this is a 50 year old gentleman with the following  problem list:  1. Hypertension.  2. COPD.  3. Chronic bradycardia.  4. Nonobstructive coronary artery disease per cardiac catheterization      in April 2010 done at [facility].  5. Tobacco use, recently quit.   HISTORY:  [name] is here today for evaluation of chest pain.  He has  chronic chest pain which started in 2010.  He describes tightness  feeling in his chest associated with dyspnea which happens with  activities.  He presented with these symptoms in 2010 to [facility].  At that  time due to his description of symptoms which was suggestive of angina,  he was referred for cardiac catheterization which was done by Dr. Warnell Forester on July 06, 2008.  I reviewed the report and according to that,  he had only a 30% mid-LAD lesion.  Otherwise, there was no other  significant disease.  Ejection fraction was normal with normal left  ventricular end-diastolic pressure.  Since that time, the patient  continued to have these symptoms.  He was found to be bradycardic at  that time and thus he was taken off atenolol.  The patient mentions that  he had bradycardia all his life, but recently it seems to be worsening  with heart rate as low as in the 30s.  He gets dizzy and lightheaded  with occasional presyncope, but there has been no syncopal episode.  The  patient gets chest tightness and dyspnea with moderate activities.  He  was recently told that he had COPD and thus he quit smoking.  The  patient has chronic back issues and he takes methadone for that.   PAST MEDICAL HISTORY:  In addition to that above, he has history of  chronic back pain requiring methadone as well as history of perforated  gastric ulcer  status post surgery in 2002.   MEDICATIONS:  1. Albuterol 2 puffs as needed.  2. Seroquel 50 mg three tablets at bedtime.  3. Lexapro 20 mg once daily.  4. Benazepril/hydrochlorothiazide 20/12.5 mg once daily.  5. Flomax 0.4 mg once daily.  6. Omeprazole 20 mg twice daily.  7. Methadone 10 mg four tablets twice daily.   ALLERGIES:  1. CODEINE.  2. PENICILLIN.  3. IBUPROFEN.   SOCIAL HISTORY:  He quit smoking in April of this year.  He used to  smoke one pack per day since he was 21 years all.  He denies any  previous or current recreational drug use.  He drinks alcohol  occasionally.  He is on methadone, but denies any previous history of  drug use.  The patient is currently unemployed and disabled.  He worked  many years in [employer] and according to him, he was possibly exposed  to asbestos.   FAMILY HISTORY:  Negative for premature coronary artery disease.  His  father died at the age of 85 from congestive heart failure.   REVIEW OF SYSTEMS:  Remarkable for dyspnea and chest pain as mentioned  above.  There is also generalized fatigue and heartburn.  He has  arthritis pain as well as anxiety.  A full review of system was  performed and is otherwise negative.   PHYSICAL EXAMINATION:  GENERAL:  The patient appears slightly anxious,  but in no acute distress.  VITAL SIGNS:  Weight is 213.4 pounds, blood pressure is 125/70, pulse is  41, oxygen saturation is 95% on room air.  HEENT:  Normocephalic, atraumatic.  NECK:  No JVD or carotid bruits.  RESPIRATORY:  Normal respiratory effort with no use of accessory  muscles.  Auscultation reveals normal breath sounds.  CARDIOVASCULAR:  Normal PMI.  Normal S1-S2 with no gallops or murmurs.  He is significantly bradycardic.  ABDOMEN:  Benign, nontender, nondistended.  There is a large surgical  scar from his previous perforation surgery.  EXTREMITIES:  With no clubbing, cyanosis or edema.  SKIN:  Warm and dry with no rash.   PSYCHIATRIC:  He is alert and oriented x3, but appears to be slightly  anxious.  MUSCULOSKELETAL:  There is normal muscle strength in the upper  and lower extremities.   DIAGNOSTICS:  Electrocardiogram was performed which showed marked sinus  bradycardia with a heart rate of 37 beats per minute.  There is no  evidence of heart blocks or bundle branch blocks.   IMPRESSION:  1. Chest pain and dyspnea:  This is still of unclear etiology.  He did      have cardiac catheterization done last year which showed no      obstructive coronary artery disease.  Thus I do not think this is      due to coronary ischemia.  It is possible that some of his symptoms      are related to his lung disease.  However, I am concerned about his      significant bradycardia which actually might be contributing to his      symptoms.  Thus I would like to obtain a treadmill stress test to      evaluate his heart rate response to exercise.  If he has      chronotropic incompetence, then actually a pacemaker might be      indicated to improve his symptoms.  Due to his significant dyspnea,      I will go ahead also an order an echocardiogram to evaluate for      other etiologies.  We will obtain a BNP as well.  2. Marked sinus bradycardia:  This might be contributing to his      symptoms.  I will go ahead and obtain a 24-hour Holter monitor to      look for other arrhythmias.  We will check his TSH to make sure      there is no thyroid issues contributing.  He is currently not on      any medications that can cause bradycardia.  Ultimately he might      require permanent pacemaker based on the results of the workup.      The patient will follow up with me after these tests are done.   Thank you for allowing me to participate in the care of your patient.    Sincerely,      Lorine Bears, MD  Electronically Signed    MA/MedQ  DD: 12/04/2009  DT: 12/04/2009  Job #: [id number]

## 2010-07-30 ENCOUNTER — Ambulatory Visit: Payer: Medicaid Other | Admitting: Cardiovascular Disease

## 2010-08-20 ENCOUNTER — Encounter: Payer: Self-pay | Admitting: Cardiovascular Disease

## 2010-08-20 ENCOUNTER — Ambulatory Visit (INDEPENDENT_AMBULATORY_CARE_PROVIDER_SITE_OTHER): Payer: Medicaid Other | Admitting: Cardiovascular Disease

## 2010-08-20 VITALS — BP 131/83 | HR 54 | Ht 73.0 in | Wt 225.0 lb

## 2010-08-20 DIAGNOSIS — I10 Essential (primary) hypertension: Secondary | ICD-10-CM | POA: Insufficient documentation

## 2010-08-20 DIAGNOSIS — I2510 Atherosclerotic heart disease of native coronary artery without angina pectoris: Secondary | ICD-10-CM

## 2010-08-20 DIAGNOSIS — E785 Hyperlipidemia, unspecified: Secondary | ICD-10-CM | POA: Insufficient documentation

## 2010-08-20 MED ORDER — ASPIRIN EC 81 MG PO TBEC: 81.0000 mg | DELAYED_RELEASE_TABLET | Freq: Every day | ORAL | Status: AC

## 2010-08-20 NOTE — Patient Instructions (Signed)
Your physician recommends that you schedule a follow-up appointment in: 6 months  Your physician has recommended you make the following change in your medication: decrease ASA to 81 mg daily

## 2010-08-20 NOTE — Assessment & Plan Note (Signed)
Blood pressure is well controlled. Continue current treatment. 

## 2010-08-20 NOTE — Assessment & Plan Note (Addendum)
The patient is doing well at this time with no symptoms suggestive of angina. He did have only a few episodes of chest pain since his stent placement which were brief and only with extreme activities. He does have significant exertional dyspnea which is likely due to his COPD. He asked if he can have dental extraction under general anesthesia. There is no contraindication from a cardiac standpoint. No cardiac work up is needed. However, Aspirin and Plavix cannot be stopped at this time due to drug eluting stent in LAD. Will continue aspirin 81 mg daily indefinitely as well as Plavix 75 mg once daily at least until March of 2013.

## 2010-08-20 NOTE — Progress Notes (Signed)
HPI  This is a 50 year old man who is here today for a followup visit. He has known history of coronary artery disease status post angioplasty and drug-eluting stent placement to the mid LAD in March of this year. The patient has done well since then. His anginal chest pain has improved significantly. He did have a few episodes of chest discomfort with extreme activities since his last visit but nothing compared to his previous angina. He has chronic exertional dyspnea due to his known history of COPD. He quit smoking about 6 months ago. He has been taking his medications regularly.  Allergies  Allergen Reactions  . Codeine   . Ibuprofen   . Penicillins      Current Outpatient Prescriptions on File Prior to Visit  Medication Sig Dispense Refill  . acetaminophen (TYLENOL) 325 MG tablet Take 650 mg by mouth every 6 (six) hours as needed.        . ALBUTEROL IN Inhale 2 puffs into the lungs as needed.        . benazepril-hydrochlorthiazide (LOTENSIN HCT) 20-12.5 MG per tablet Take 1 tablet by mouth daily.        . fluticasone (FLONASE) 50 MCG/ACT nasal spray 2 sprays by Nasal route daily as needed.        . methadone (DOLOPHINE) 10 MG tablet Take 40 mg by mouth 2 (two) times daily.       . nitroGLYCERIN (NITROSTAT) 0.4 MG SL tablet Place 0.4 mg under the tongue every 5 (five) minutes as needed.        Marland Kitchen omeprazole (PRILOSEC) 20 MG capsule Take 20 mg by mouth 2 (two) times daily.        . QUEtiapine (SEROQUEL) 50 MG tablet Take 50 mg by mouth at bedtime. To take 2-3 tablets at hs       . DISCONTD: clonazePAM (KLONOPIN) 0.5 MG tablet Take 0.5 mg by mouth 2 (two) times daily.        Marland Kitchen DISCONTD: escitalopram (LEXAPRO) 20 MG tablet Take 20 mg by mouth daily.        Marland Kitchen DISCONTD: mirtazapine (REMERON SOL-TAB) 15 MG disintegrating tablet Take 15 mg by mouth at bedtime.        Marland Kitchen DISCONTD: Tamsulosin HCl (FLOMAX) 0.4 MG CAPS Take 0.4 mg by mouth daily.           Past Medical History  Diagnosis Date  .  Heart murmur   . COPD (chronic obstructive pulmonary disease)   . Bradycardia     chronic  . SOB (shortness of breath)   . Coronary artery disease april 2010    cardiac cath  . Gastric ulcer with perforation 2002  . Arthritis   . Anxiety and depression   . Hyperlipidemia   . Hypertension      Past Surgical History  Procedure Date  . Knee surgery   . Hernia repair   . Cardiac catheterization 06/2008    At [facility]. Mild LAD stenosis  . Cardiac catheterization 05/2010    95% mid LAD stenosis, 20% RCA stenosis  . Coronary angioplasty with stent placement 05/2010    Mid LAD: DES: 3.0 X23 mm Promus     Family History  Problem Relation Age of Onset  . Heart failure Father 38    CHF     History   Social History  . Marital Status: Married    Spouse Name: N/A    Number of Children: N/A  . Years of Education: N/A  Occupational History  . Not on file.   Social History Main Topics  . Smoking status: Former Games developer  . Smokeless tobacco: Not on file  . Alcohol Use: Yes  . Drug Use: No  . Sexually Active:    Other Topics Concern  . Not on file   Social History Narrative  . No narrative on file       PHYSICAL EXAM   BP 131/83  Pulse 54  Ht 6\' 1"  (1.854 m)  Wt 225 lb (102.059 kg)  BMI 29.69 kg/m2  SpO2 95%  Constitutional: He is oriented to person, place, and time. He appears well-developed and well-nourished. No distress.  HENT: No nasal discharge.  Head: Normocephalic and atraumatic.  Eyes: Pupils are equal, round, and reactive to light. Right eye exhibits no discharge. Left eye exhibits no discharge.  Neck: Normal range of motion. Neck supple. No JVD present. No thyromegaly present.  Cardiovascular: Normal rate, regular rhythm, normal heart sounds and intact distal pulses. Exam reveals no gallop and no friction rub.  No murmur heard.  Pulmonary/Chest: Effort normal and breath sounds normal. No stridor. No respiratory distress. He has no wheezes. He has no  rales. He exhibits no tenderness.  Abdominal: Soft. Bowel sounds are normal. He exhibits no distension. There is no tenderness. There is no rebound and no guarding.  Musculoskeletal: Normal range of motion. He exhibits no edema and no tenderness.  Neurological: He is alert and oriented to person, place, and time. Coordination normal.  Skin: Skin is warm and dry. No rash noted. He is not diaphoretic. No erythema. No pallor.  Psychiatric: He has a normal mood and affect. His behavior is normal. Judgment and thought content normal.       EKG: Sinus bradycardia with a heart rate of 54 beats per minute. Poor R wave progression in the precordial leads. no significant ST changes.  ASSESSMENT AND PLAN

## 2010-08-20 NOTE — Progress Notes (Signed)
Addended by: Reine Just on: 08/20/2010 01:17 PM   Modules accepted: Orders

## 2010-08-20 NOTE — Assessment & Plan Note (Signed)
I placed him on atorvastatin 80 mg daily after his stent placement. Since then, it seems that he is now on simvastatin 20 mg once daily. He thinks that he had labs checked with Dr. Marina Goodell recently. Due to his history of coronary artery disease, I recommend a target LDL of less than 70. If this is not achieved with current dose of simvastatin, then it might be worth switching him back to atorvastatin.

## 2010-09-04 ENCOUNTER — Telehealth: Payer: Self-pay | Admitting: Cardiovascular Disease

## 2010-09-04 NOTE — Telephone Encounter (Signed)
Pt need a clearance for tooth extraction. Also pt need to be  sedate.   Appt is 8/10 @ 12:30 . Contacted name :  Valentina Gu office # [phone]  Fax # [phone].

## 2010-09-07 NOTE — Telephone Encounter (Signed)
Did Dr Graciela Husbands take care of this?

## 2010-09-10 ENCOUNTER — Encounter: Payer: Self-pay | Admitting: Cardiovascular Disease

## 2010-09-10 NOTE — Telephone Encounter (Signed)
I thought this was taken care of

## 2010-09-10 NOTE — Telephone Encounter (Signed)
This is Dr. Jari Sportsman patient. Will send to him for review.

## 2010-09-10 NOTE — Telephone Encounter (Signed)
The clearance has been sent in to dentist.

## 2010-12-31 ENCOUNTER — Other Ambulatory Visit: Payer: Self-pay

## 2010-12-31 MED ORDER — CLOPIDOGREL BISULFATE 75 MG PO TABS: 75.0000 mg | ORAL_TABLET | Freq: Every day | ORAL | Status: DC

## 2011-02-19 ENCOUNTER — Institutional Professional Consult (permissible substitution): Payer: Medicaid Other | Admitting: Internal Medicine

## 2011-02-20 ENCOUNTER — Ambulatory Visit (INDEPENDENT_AMBULATORY_CARE_PROVIDER_SITE_OTHER): Payer: Medicaid Other | Admitting: Internal Medicine

## 2011-02-20 ENCOUNTER — Encounter: Payer: Self-pay | Admitting: Internal Medicine

## 2011-02-20 ENCOUNTER — Ambulatory Visit (INDEPENDENT_AMBULATORY_CARE_PROVIDER_SITE_OTHER)
Admission: RE | Admit: 2011-02-20 | Discharge: 2011-02-20 | Disposition: A | Discharge status: Home / Self Care | Payer: Medicaid Other | Source: Ambulatory Visit | Attending: Internal Medicine | Admitting: Internal Medicine

## 2011-02-20 VITALS — BP 138/80 | HR 46 | Temp 98.2°F | Ht 73.0 in | Wt 221.6 lb

## 2011-02-20 DIAGNOSIS — J449 Chronic obstructive pulmonary disease, unspecified: Secondary | ICD-10-CM

## 2011-02-20 DIAGNOSIS — I10 Essential (primary) hypertension: Secondary | ICD-10-CM

## 2011-02-20 MED ORDER — OLMESARTAN MEDOXOMIL-HCTZ 20-12.5 MG PO TABS: 1.0000 | ORAL_TABLET | Freq: Every day | ORAL | Status: DC

## 2011-02-20 NOTE — Assessment & Plan Note (Signed)
  When respiratory symptoms begin well after a patient reports complete smoking cessation,  it is very hard to "blame" COPD specifically or airways disorders in general  ie it doesn't make any more sense than hearing a  [employer]SC[employer] driver wrecked his car while driving his kids to school or a surgeon sliced his hand off carving roast beef (it must be rare indeed!)     That is to say, once the high risk activity stops,  the symptoms should not suddenly erupt or markedly worsen.  If so, the differential diagnosis should include  obesity/deconditioning,  LPR/Reflux/Aspiration syndromes,  occult CHF, or  especially side effect of medications commonly used in this population like ACE inhibitors.    The proper method of use, as well as anticipated side effects, of this metered-dose inhaler are discussed and demonstrated to the patient. Improved to 75% with coaching.  Needs pft's off ace inhibitor trial.

## 2011-02-20 NOTE — Assessment & Plan Note (Signed)

## 2011-02-20 NOTE — Patient Instructions (Addendum)
Work on inhaler technique:  relax and gently blow all the way out then take a nice smooth deep breath back in, triggering the inhaler at same time you start breathing in.  Hold for up to 5 seconds if you can.  Rinse and gargle with water when done   If your mouth or throat starts to bother you,   I suggest you time the inhaler to your dental care and after using the inhaler(s) brush teeth and tongue with a baking soda containing toothpaste and when you rinse this out, gargle with it first to see if this helps your mouth and throat.      Dulera 200  Take 2 puffs first thing in am and then another 2 puffs about 12 hours later- as you improve you may find you don't need it as much and it's ok to taper off   Stop benazpril and use benicar 20/12.5 one daily instead - if too strong take a half daily  Please remember to go to the x-ray department downstairs for your tests - we will call you with the results when they are available.     Please schedule a follow up office visit in 4 weeks, sooner if needed for PFT's

## 2011-02-20 NOTE — Progress Notes (Signed)
  Subjective:    Patient ID: Ryan Quinn, male    DOB: 09/03/1960, 50 y.o.   MRN: [id number]  HPI  37 yowm quit smoking around 06/2009 when cxr showed emphysema with mild doe but definitely worsened overall since he quit smoking.    02/20/2011 1st pulmonary office visit for indolent onset progressive doe x sev years to point where mailbox to house is difficult and has to sit and rest. Def worse p quit smoking,  Coughs also a lot and looses breath when coughs. dulera helps some despite poor hfa, minimal mucoid sputum, not typically worse in am.  Actually Sleeping ok without nocturnal  or early am exacerbation  of respiratory  c/o's or need for noct saba. Also denies any obvious fluctuation of symptoms with weather or environmental changes or other aggravating or alleviating factors except as outlined above    ROS  At present neg for  any significant sore throat, dysphagia, itching, sneezing,  nasal congestion or excess/ purulent secretions,  fever, chills, sweats, unintended wt loss, classically pleuritic or exertional cp, hempoptysis, orthopnea pnd or leg swelling.  Also denies presyncope, palpitations, heartburn, abdominal pain, nausea, vomiting, diarrhea  or change in bowel or urinary habits, dysuria,hematuria,  rash, arthralgias, visual complaints, headache, numbness weakness or ataxia.     Review of Systems  HENT: Positive for congestion.   Respiratory: Positive for cough and shortness of breath.   Cardiovascular: Positive for chest pain.  Musculoskeletal: Positive for arthralgias.  Psychiatric/Behavioral: Positive for dysphoric mood. The patient is nervous/anxious.        Objective:   Physical Exam  amb hoarse wm nad with component of pseudowheeze Wt 221 02/20/2011  HEENT mild turbinate edema.  Oropharynx no thrush or excess pnd or cobblestoning.  No JVD or cervical adenopathy. Mild accessory muscle hypertrophy. Trachea midline, nl thryroid. Chest was hyperinflated by percussion  with diminished breath sounds and moderate increased exp time without wheeze. Hoover sign positive at mid inspiration. Regular rate and rhythm without murmur gallop or rub or increase P2 or edema.  Abd: no hsm, nl excursion. Ext warm without cyanosis or clubbing.    CXR  02/20/2011 : No acute abnormality.       Assessment & Plan:

## 2011-02-25 ENCOUNTER — Ambulatory Visit: Payer: Medicaid Other | Admitting: Cardiology

## 2011-03-07 ENCOUNTER — Encounter: Payer: Self-pay | Admitting: *Deleted

## 2011-03-07 NOTE — Progress Notes (Signed)
Quick Note:  Letter mailed to pt ______ 

## 2011-03-19 ENCOUNTER — Encounter: Payer: Self-pay | Admitting: Cardiology

## 2011-03-27 ENCOUNTER — Ambulatory Visit (INDEPENDENT_AMBULATORY_CARE_PROVIDER_SITE_OTHER): Payer: Medicaid Other | Admitting: Internal Medicine

## 2011-03-27 ENCOUNTER — Encounter: Payer: Self-pay | Admitting: Internal Medicine

## 2011-03-27 ENCOUNTER — Other Ambulatory Visit (INDEPENDENT_AMBULATORY_CARE_PROVIDER_SITE_OTHER): Payer: Medicaid Other

## 2011-03-27 VITALS — BP 138/72 | HR 55 | Temp 98.1°F | Ht 73.0 in | Wt 215.0 lb

## 2011-03-27 DIAGNOSIS — I10 Essential (primary) hypertension: Secondary | ICD-10-CM

## 2011-03-27 DIAGNOSIS — R0609 Other forms of dyspnea: Secondary | ICD-10-CM

## 2011-03-27 DIAGNOSIS — J449 Chronic obstructive pulmonary disease, unspecified: Secondary | ICD-10-CM

## 2011-03-27 DIAGNOSIS — R0989 Other specified symptoms and signs involving the circulatory and respiratory systems: Secondary | ICD-10-CM

## 2011-03-27 LAB — BASIC METABOLIC PANEL
CO2: 27 mEq/L (ref 19–32)
Chloride: 100 mEq/L (ref 96–112)
Glucose, Bld: 106 mg/dL — ABNORMAL HIGH (ref 70–99)
Sodium: 138 mEq/L (ref 135–145)

## 2011-03-27 LAB — CBC WITH DIFFERENTIAL/PLATELET
Basophils Relative: 0.4 % (ref 0.0–3.0)
Eosinophils Relative: 0.4 % (ref 0.0–5.0)
HCT: 46.4 % (ref 39.0–52.0)
Lymphs Abs: 1.9 10*3/uL (ref 0.7–4.0)
MCHC: 34 g/dL (ref 30.0–36.0)
MCV: 95.5 fl (ref 78.0–100.0)
Monocytes Absolute: 0.5 10*3/uL (ref 0.1–1.0)
Neutro Abs: 4.1 10*3/uL (ref 1.4–7.7)
RBC: 4.86 Mil/uL (ref 4.22–5.81)
WBC: 6.6 10*3/uL (ref 4.5–10.5)

## 2011-03-27 LAB — PULMONARY FUNCTION TEST

## 2011-03-27 NOTE — Progress Notes (Signed)
PFT done today. 

## 2011-03-27 NOTE — Patient Instructions (Addendum)
Please remember to go to the lab   department downstairs for your tests - we will call you with the results when they are available.    Please see patient coordinator before you leave today  to schedule a CPST - you must be as physically active as you can prior to the study preferably 30 minutes but pace yourself.   Stop dulera if you don't think it's helping - restart if you feel you need it.  We will call you with all your results to tell you what the next step is but the bottom line is your lung function is quite good and always will be unless you resume smoking

## 2011-03-27 NOTE — Progress Notes (Signed)
  Subjective:    Patient ID: Ryan Quinn, male    DOB: May 18, 1960    MRN: [id number]  HPI  Brief patient profile:  50 yowm quit smoking around 06/2009 when cxr showed emphysema with mild doe but definitely worsened overall since he quit smoking. GOLD I copd established 03/27/2011    02/20/2011 1st pulmonary office visit for indolent onset progressive doe x sev years to point where mailbox to house is difficult and has to sit and rest. Def worse p quit smoking,  Coughs also a lot and looses breath when coughs. dulera helps some despite poor hfa, minimal mucoid sputum, not typically worse in am.   rec Work on inhaler technique:   Dulera 200  Take 2 puffs first thing in am and then another 2 puffs about 12 hours later- as you improve you may find you don't need it as much and it's ok to taper off  Stop benazpril and use benicar 20/12.5 one daily instead - if too strong take a half daily   03/27/2011 f/u ov/Ryan Quinn cc cough is better, breathing is the same = mailbox to house always out of breath at end of walk no real variability, no excess or purulent sputum.   Sleeping ok without nocturnal  or early am exacerbation  of respiratory  c/o's or need for noct saba. Also denies any obvious fluctuation of symptoms with weather or environmental changes or other aggravating or alleviating factors except as outlined above    ROS  At present neg for  any significant sore throat, dysphagia, itching, sneezing,  nasal congestion or excess/ purulent secretions,  fever, chills, sweats, unintended wt loss, classically pleuritic or exertional cp, hempoptysis, orthopnea pnd or leg swelling.  Also denies presyncope, palpitations, heartburn, abdominal pain, nausea, vomiting, diarrhea  or change in bowel or urinary habits, dysuria,hematuria,  rash, arthralgias, visual complaints, headache, numbness weakness or ataxia.           Objective:   Physical Exam  amb wm nad with no obvious pseudowheeze Wt 221  02/20/2011  > 215 03/27/2011  HEENT mild turbinate edema.  Oropharynx no thrush or excess pnd or cobblestoning.  No JVD or cervical adenopathy. No  accessory muscle hypertrophy. Trachea midline, nl thryroid. Chest was hyperinflated by percussion with diminished breath sounds and mild increased exp time without wheeze. Hoover sign positive at end inspiration. Regular rate and rhythm without murmur gallop or rub or increase P2 or edema.  Abd: no hsm, nl excursion. Ext warm without cyanosis or clubbing.    CXR  02/20/2011 : No acute abnormality.   Labs 03/27/11 wnl including tsh, bnp, cbc, bmet    Assessment & Plan:

## 2011-03-27 NOTE — Assessment & Plan Note (Addendum)
-   HFA 75% effective p coaching.    - PFT's 03/27/2011  FEV1  3.29 (85%) ratio 65 and no better p B2 DLCO 105%    - 03/27/2011  Walked RA x 3 laps @ 185 ft each stopped due to  End of study, no desat or tachycardia  Symptoms are markedly disproportionate to objective findings and not clear this is a lung problem but pt does appear to have difficult airway management issues. Next step is CPST to sort out the mechanism for ex intolerance as certainly not explained by the minimal airflow obstruction present on today's pft's. Can't rule out asthma so ok to use the dulera prn at this point.

## 2011-03-29 NOTE — Assessment & Plan Note (Signed)
Try off ACE inhibitors 02/20/2011 due to ? Pseudoasthma > resolved  Would avoid acei here until sort out the cause of his unexplained doe so as not to muddy the waters and because cough and pseudowheeze better off this class

## 2011-04-01 NOTE — Progress Notes (Signed)
Quick Note:  Spoke with pt and notified of results per Dr. Wert. Pt verbalized understanding and denied any questions.  ______ 

## 2011-04-03 ENCOUNTER — Encounter: Payer: Self-pay | Admitting: Internal Medicine

## 2011-04-08 ENCOUNTER — Ambulatory Visit (HOSPITAL_COMMUNITY): Payer: Medicaid Other

## 2011-04-10 ENCOUNTER — Ambulatory Visit (HOSPITAL_COMMUNITY): Payer: Medicaid Other | Attending: Internal Medicine

## 2011-04-16 ENCOUNTER — Telehealth: Payer: Self-pay | Admitting: Internal Medicine

## 2011-04-17 NOTE — Telephone Encounter (Signed)
Returning [name]'s call.Ryan Quinn ° °

## 2011-04-17 NOTE — Telephone Encounter (Signed)
lmtcb

## 2011-04-18 NOTE — Telephone Encounter (Signed)
appt changed to 05/07/11@11 :30am pt is aware

## 2011-04-23 ENCOUNTER — Ambulatory Visit: Payer: Medicaid Other | Admitting: Cardiology

## 2011-05-07 ENCOUNTER — Ambulatory Visit (HOSPITAL_COMMUNITY): Payer: Medicaid Other

## 2011-05-09 ENCOUNTER — Ambulatory Visit (INDEPENDENT_AMBULATORY_CARE_PROVIDER_SITE_OTHER): Payer: Medicaid Other | Admitting: Cardiovascular Disease

## 2011-05-09 ENCOUNTER — Telehealth: Payer: Self-pay | Admitting: *Deleted

## 2011-05-09 ENCOUNTER — Encounter: Payer: Self-pay | Admitting: *Deleted

## 2011-05-09 ENCOUNTER — Encounter: Payer: Self-pay | Admitting: Cardiovascular Disease

## 2011-05-09 DIAGNOSIS — R079 Chest pain, unspecified: Secondary | ICD-10-CM

## 2011-05-09 DIAGNOSIS — I498 Other specified cardiac arrhythmias: Secondary | ICD-10-CM

## 2011-05-09 DIAGNOSIS — E785 Hyperlipidemia, unspecified: Secondary | ICD-10-CM

## 2011-05-09 DIAGNOSIS — I10 Essential (primary) hypertension: Secondary | ICD-10-CM

## 2011-05-09 NOTE — Assessment & Plan Note (Signed)
The patient continues to have intermittent bradycardia. This surprisingly improved after his LAD PCI. His heart rate today is 38 beats per minute. It is possible that this might be contributing to some of symptoms of exertional dyspnea and exercise intolerance.  If cardiac catheterization does not show evidence of obstructive coronary artery disease to explain his symptoms, we might have to evaluate him for a permanent pacemaker placement.

## 2011-05-09 NOTE — Assessment & Plan Note (Signed)
He is on simvastatin 40 mg once daily. I recommend a target LDL of less than 70.

## 2011-05-09 NOTE — Telephone Encounter (Signed)
No precert required.  Medicaid only 

## 2011-05-09 NOTE — Assessment & Plan Note (Signed)
His blood pressure is well controlled. Continue current medications. 

## 2011-05-09 NOTE — Telephone Encounter (Signed)
(  L) heart Cath Main lab 05/15/11 Dr. Darrin Nipper

## 2011-05-09 NOTE — Assessment & Plan Note (Signed)
Ryan Quinn is having typical anginal symptoms similar to his symptoms before his LAD PCI. Significant physical limitations related to that. I am concerned about possibility of in-stent restenosis.  Due to severity of his symptoms, I recommend proceeding with cardiac catheterization and possible coronary intervention. He prefers to have it done through the radial approach. We'll likely use the left radial artery as his pulse on the right side is very faint. In the meantime, continue treatment with aspirin and Plavix.

## 2011-05-09 NOTE — Progress Notes (Signed)
HPI  This is a 51-year-old man who is here today for a followup visit. She has known history of coronary artery disease status post angioplasty and drug-eluting stent placement to the left anterior descending artery in March of 2012. He had a 95% stenosis at that time. He had significant improvement in his symptoms after the stent placement. However, over the last few months the patient describes recurrent exertional dyspnea and chest pain similar to the symptoms before his PCI. He gets a tightness feeling with minimal activities associated with dyspnea. He does have history of chronic bradycardia but that actually improved after his PCI. He is having problems with bradycardia again. His heart rate on the ECG today is 38 beats per minute. He did quit smoking last year and gained weight. He has seen by pulmonary but his testing only showed mild COPD.  Allergies  Allergen Reactions  . Codeine     rash  . Ibuprofen     Upset stomach  . Penicillins     rash     Current Outpatient Prescriptions on File Prior to Visit  Medication Sig Dispense Refill  . acetaminophen (TYLENOL) 325 MG tablet Take 650 mg by mouth every 6 (six) hours as needed.        . ALBUTEROL IN Inhale 2 puffs into the lungs as needed.        . ALPRAZolam (XANAX) 0.5 MG tablet Take 0.5 mg by mouth 3 (three) times daily.        . aspirin EC 81 MG tablet Take 1 tablet (81 mg total) by mouth daily.  150 tablet  2  . clopidogrel (PLAVIX) 75 MG tablet Take 1 tablet (75 mg total) by mouth daily.  30 tablet  5  . escitalopram (LEXAPRO) 20 MG tablet Take 20 mg by mouth daily.        . fluticasone (FLONASE) 50 MCG/ACT nasal spray 2 sprays by Nasal route daily as needed.        . methadone (DOLOPHINE) 10 MG tablet Take 10 mg by mouth 4 (four) times daily. 3 tablets three times per day      . Mometasone Furo-Formoterol Fum (DULERA) 200-5 MCG/ACT AERO Inhale 3 puffs into the lungs 2 (two) times daily.      . nitroGLYCERIN (NITROSTAT) 0.4 MG  SL tablet Place 0.4 mg under the tongue every 5 (five) minutes as needed.        . olmesartan-hydrochlorothiazide (BENICAR HCT) 20-12.5 MG per tablet Take 1 tablet by mouth daily.      . omeprazole (PRILOSEC) 20 MG capsule Take 20 mg by mouth daily before breakfast.       . QUEtiapine (SEROQUEL) 50 MG tablet Take 100 mg by mouth at bedtime. To take 2-3 tablets at hs         Past Medical History  Diagnosis Date  . Heart murmur   . COPD (chronic obstructive pulmonary disease)   . Bradycardia     chronic  . SOB (shortness of breath)   . Coronary artery disease april 2010    cardiac cath  . Gastric ulcer with perforation 2002  . Arthritis   . Anxiety and depression   . Hyperlipidemia   . Hypertension   . Sinus bradycardia      Past Surgical History  Procedure Date  . Knee surgery   . Hernia repair   . Cardiac catheterization 06/2008    At [facility]. Mild LAD stenosis  . Cardiac catheterization 05/2010      95% mid LAD stenosis, 20% RCA stenosis  . Varicose vein surgery   . Tonsillectomy   . Coronary angioplasty with stent placement 05/2010    Mid LAD: DES: 3.0 X23 mm Promus     Family History  Problem Relation Age of Onset  . Heart failure Father 57    CHF  . Brain cancer Father   . Lung cancer Father   . Bone cancer Father   . Prostate cancer Paternal Uncle      History   Social History  . Marital Status: Married    Spouse Name: N/A    Number of Children: 3  . Years of Education: N/A   Occupational History  . disabled    Social History Main Topics  . Smoking status: Former Smoker -- 2.0 packs/day for 25 years    Types: Cigarettes, Cigars    Quit date: 08/10/2010  . Smokeless tobacco: Not on file   Comment: cigar occasionally  . Alcohol Use: No  . Drug Use: No  . Sexually Active: Not on file   Other Topics Concern  . Not on file   Social History Narrative  . No narrative on file     PHYSICAL EXAM   BP 130/72  Pulse 57  Resp 18  Ht 6' 1" (1.854  m)  Wt 224 lb (101.606 kg)  BMI 29.55 kg/m2  SpO2 92%  Constitutional: He is oriented to person, place, and time. He appears well-developed and well-nourished. No distress.  HENT: No nasal discharge.  Head: Normocephalic and atraumatic.  Eyes: Pupils are equal and round. Right eye exhibits no discharge. Left eye exhibits no discharge.  Neck: Normal range of motion. Neck supple. No JVD present. No thyromegaly present.  Cardiovascular: Normal rate, regular rhythm, normal heart sounds and. Exam reveals no gallop and no friction rub. No murmur heard.  Pulmonary/Chest: Effort normal and breath sounds normal. No stridor. No respiratory distress. He has no wheezes. He has no rales. He exhibits no tenderness.  Abdominal: Soft. Bowel sounds are normal. He exhibits no distension. There is no tenderness. There is no rebound and no guarding.  Musculoskeletal: Normal range of motion. He exhibits no edema and no tenderness.  Neurological: He is alert and oriented to person, place, and time. Coordination normal.  Skin: Skin is warm and dry. No rash noted. He is not diaphoretic. No erythema. No pallor.  Psychiatric: He has a normal mood and affect. His behavior is normal. Judgment and thought content normal.  The right radial pulse is very faint. Left radial pulse is normal.    EKG: Marked sinus bradycardia with a heart rate of 38 beats per minute. No evidence of AV block or bundle branch blocks. No evidence of ischemia.   ASSESSMENT AND PLAN   

## 2011-05-09 NOTE — Patient Instructions (Addendum)

## 2011-05-13 ENCOUNTER — Encounter (HOSPITAL_COMMUNITY): Payer: Self-pay | Admitting: Pharmacy Technician

## 2011-05-15 ENCOUNTER — Ambulatory Visit (HOSPITAL_COMMUNITY)
Admission: RE | Admit: 2011-05-15 | Discharge: 2011-05-15 | Disposition: A | Discharge status: Home / Self Care | Payer: Medicaid Other | Source: Ambulatory Visit | Attending: Cardiovascular Disease | Admitting: Cardiovascular Disease

## 2011-05-15 ENCOUNTER — Encounter (HOSPITAL_COMMUNITY)
Admission: RE | Disposition: A | Discharge status: Home / Self Care | Payer: Self-pay | Source: Ambulatory Visit | Attending: Cardiovascular Disease

## 2011-05-15 DIAGNOSIS — I2510 Atherosclerotic heart disease of native coronary artery without angina pectoris: Secondary | ICD-10-CM | POA: Insufficient documentation

## 2011-05-15 DIAGNOSIS — Z7902 Long term (current) use of antithrombotics/antiplatelets: Secondary | ICD-10-CM | POA: Insufficient documentation

## 2011-05-15 DIAGNOSIS — Z7982 Long term (current) use of aspirin: Secondary | ICD-10-CM | POA: Insufficient documentation

## 2011-05-15 DIAGNOSIS — F341 Dysthymic disorder: Secondary | ICD-10-CM | POA: Insufficient documentation

## 2011-05-15 DIAGNOSIS — Z8711 Personal history of peptic ulcer disease: Secondary | ICD-10-CM | POA: Insufficient documentation

## 2011-05-15 DIAGNOSIS — R0989 Other specified symptoms and signs involving the circulatory and respiratory systems: Secondary | ICD-10-CM | POA: Insufficient documentation

## 2011-05-15 DIAGNOSIS — J4489 Other specified chronic obstructive pulmonary disease: Secondary | ICD-10-CM | POA: Insufficient documentation

## 2011-05-15 DIAGNOSIS — Z9861 Coronary angioplasty status: Secondary | ICD-10-CM | POA: Insufficient documentation

## 2011-05-15 DIAGNOSIS — R079 Chest pain, unspecified: Secondary | ICD-10-CM | POA: Insufficient documentation

## 2011-05-15 DIAGNOSIS — J449 Chronic obstructive pulmonary disease, unspecified: Secondary | ICD-10-CM | POA: Insufficient documentation

## 2011-05-15 DIAGNOSIS — I10 Essential (primary) hypertension: Secondary | ICD-10-CM | POA: Insufficient documentation

## 2011-05-15 DIAGNOSIS — E785 Hyperlipidemia, unspecified: Secondary | ICD-10-CM | POA: Insufficient documentation

## 2011-05-15 DIAGNOSIS — Z79899 Other long term (current) drug therapy: Secondary | ICD-10-CM | POA: Insufficient documentation

## 2011-05-15 DIAGNOSIS — I498 Other specified cardiac arrhythmias: Secondary | ICD-10-CM | POA: Insufficient documentation

## 2011-05-15 DIAGNOSIS — Z01812 Encounter for preprocedural laboratory examination: Secondary | ICD-10-CM | POA: Insufficient documentation

## 2011-05-15 DIAGNOSIS — R0609 Other forms of dyspnea: Secondary | ICD-10-CM | POA: Insufficient documentation

## 2011-05-15 HISTORY — PX: LEFT HEART CATHETERIZATION WITH CORONARY ANGIOGRAM: SHX5451

## 2011-05-15 LAB — CBC
Hemoglobin: 14.4 g/dL (ref 13.0–17.0)
MCHC: 35.7 g/dL (ref 30.0–36.0)
Platelets: 137 10*3/uL — ABNORMAL LOW (ref 150–400)

## 2011-05-15 LAB — BASIC METABOLIC PANEL
GFR calc Af Amer: >90 mL/min (ref >90)
GFR calc non Af Amer: 80 mL/min — ABNORMAL LOW (ref >90)
Glucose, Bld: 96 mg/dL (ref 70–99)
Potassium: 4.1 mEq/L (ref 3.5–5.1)
Sodium: 133 mEq/L — ABNORMAL LOW (ref 135–145)

## 2011-05-15 LAB — PROTIME-INR
INR: 0.9 (ref 0.00–1.49)
Prothrombin Time: 12.3 seconds (ref 11.6–15.2)

## 2011-05-15 SURGERY — LEFT HEART CATHETERIZATION WITH CORONARY ANGIOGRAM
Anesthesia: LOCAL

## 2011-05-15 MED ORDER — FENTANYL CITRATE 0.05 MG/ML IJ SOLN
INTRAMUSCULAR | Status: AC
  Filled 2011-05-15: qty 2

## 2011-05-15 MED ORDER — METHADONE HCL 10 MG PO TABS
40.0000 mg | ORAL_TABLET | Freq: Once | ORAL | Status: AC
  Administered 2011-05-15: 40 mg via ORAL

## 2011-05-15 MED ORDER — VERAPAMIL HCL 2.5 MG/ML IV SOLN
INTRAVENOUS | Status: AC
  Filled 2011-05-15: qty 2

## 2011-05-15 MED ORDER — ASPIRIN 81 MG PO CHEW
324.0000 mg | CHEWABLE_TABLET | ORAL | Status: AC
  Administered 2011-05-15: 324 mg via ORAL
  Filled 2011-05-15: qty 4

## 2011-05-15 MED ORDER — SODIUM CHLORIDE 0.9 % IV SOLN: INTRAVENOUS | Status: AC

## 2011-05-15 MED ORDER — SODIUM CHLORIDE 0.9 % IV SOLN: 250.0000 mL | INTRAVENOUS | Status: DC | PRN

## 2011-05-15 MED ORDER — LIDOCAINE HCL (PF) 1 % IJ SOLN
INTRAMUSCULAR | Status: AC
  Filled 2011-05-15: qty 30

## 2011-05-15 MED ORDER — METHADONE HCL 10 MG PO TABS
40.0000 mg | ORAL_TABLET | ORAL | Status: DC
  Filled 2011-05-15: qty 4

## 2011-05-15 MED ORDER — HEPARIN (PORCINE) IN NACL 2-0.9 UNIT/ML-% IJ SOLN
INTRAMUSCULAR | Status: AC
  Filled 2011-05-15: qty 2000

## 2011-05-15 MED ORDER — SODIUM CHLORIDE 0.9 % IV SOLN
INTRAVENOUS | Status: DC
  Administered 2011-05-15: 08:00:00 via INTRAVENOUS

## 2011-05-15 MED ORDER — NITROGLYCERIN 0.2 MG/ML ON CALL CATH LAB
INTRAVENOUS | Status: AC
  Filled 2011-05-15: qty 1

## 2011-05-15 MED ORDER — HEPARIN SODIUM (PORCINE) 1000 UNIT/ML IJ SOLN
INTRAMUSCULAR | Status: AC
  Filled 2011-05-15: qty 1

## 2011-05-15 MED ORDER — SODIUM CHLORIDE 0.9 % IJ SOLN
3.0000 mL | INTRAMUSCULAR | Status: DC | PRN
Start: 2011-05-15 — End: 2011-05-15

## 2011-05-15 MED ORDER — ONDANSETRON HCL 4 MG/2ML IJ SOLN: 4.0000 mg | Freq: Four times a day (QID) | INTRAMUSCULAR | Status: DC | PRN

## 2011-05-15 MED ORDER — ACETAMINOPHEN 325 MG PO TABS: 650.0000 mg | ORAL_TABLET | ORAL | Status: DC | PRN

## 2011-05-15 MED ORDER — SODIUM CHLORIDE 0.9 % IJ SOLN: 3.0000 mL | Freq: Two times a day (BID) | INTRAMUSCULAR | Status: DC

## 2011-05-15 MED ORDER — DIAZEPAM 5 MG PO TABS
5.0000 mg | ORAL_TABLET | ORAL | Status: AC
  Administered 2011-05-15: 5 mg via ORAL
  Filled 2011-05-15: qty 1

## 2011-05-15 MED ORDER — MIDAZOLAM HCL 2 MG/2ML IJ SOLN
INTRAMUSCULAR | Status: AC
  Filled 2011-05-15: qty 2

## 2011-05-15 NOTE — Interval H&P Note (Signed)
History and Physical Interval Note:  05/15/2011 10:32 AM  Ryan Quinn  has presented today for surgery, with the diagnosis of chest pain/cad  The various methods of treatment have been discussed with the patient and family. After consideration of risks, benefits and other options for treatment, the patient has consented to  Procedure(s) (LRB): LEFT HEART CATHETERIZATION WITH CORONARY ANGIOGRAM (N/A) as a surgical intervention .  The patients' history has been reviewed, patient examined, no change in status, stable for surgery.  I have reviewed the patients' chart and labs.  Questions were answered to the patient's satisfaction.     Lorine Bears

## 2011-05-15 NOTE — Discharge Instructions (Signed)
Radial Site Care Refer to this sheet in the next few weeks. These instructions provide you with information on caring for yourself after your procedure. Your caregiver may also give you more specific instructions. Your treatment has been planned according to current medical practices, but problems sometimes occur. Call your caregiver if you have any problems or questions after your procedure. HOME CARE INSTRUCTIONS  You may shower the day after the procedure.Remove the bandage (dressing) and gently wash the site with plain soap and water.Gently pat the site dry.   Do not apply powder or lotion to the site.   Do not submerge the affected site in water for 3 to 5 days.   Inspect the site at least twice daily.   Do not flex or bend the affected arm for 24 hours.   No lifting over 5 pounds (2.3 kg) for 5 days after your procedure.   Do not drive home if you are discharged the same day of the procedure. Have someone else drive you.   You may drive 24 hours after the procedure unless otherwise instructed by your caregiver.   Do not operate machinery or power tools for 24 hours.   A responsible adult should be with you for the first 24 hours after you arrive home.  What to expect:  Any bruising will usually fade within 1 to 2 weeks.   Blood that collects in the tissue (hematoma) may be painful to the touch. It should usually decrease in size and tenderness within 1 to 2 weeks.  SEEK IMMEDIATE MEDICAL CARE IF:  You have unusual pain at the radial site.   You have redness, warmth, swelling, or pain at the radial site.   You have drainage (other than a small amount of blood on the dressing).   You have chills.   You have a fever or persistent symptoms for more than 72 hours.   You have a fever and your symptoms suddenly get worse.   Your arm becomes pale, cool, tingly, or numb.   You have heavy bleeding from the site. Hold pressure on the site.  Document Released: 03/30/2010  Document Revised: 02/14/2011 Document Reviewed: 03/30/2010 [employer] Patient Information 2012 [employer]. 

## 2011-05-15 NOTE — CV Procedure (Signed)
   Cardiac Catheterization Procedure Note  Name: Ryan Quinn MRN: [id number] DOB: 09-28-1960  Procedure: Left Heart Cath, Selective Coronary Angiography, LV angiography  Indication: Exertional dyspnea and fatigue with nonactive coronary artery disease.  Medications:  Sedation:  2 mg IV Versed, 75 mcg IV Fentanyl  Contrast:  70 mL Omnipaque   Procedural Details: The left wrist was prepped, draped, and anesthetized with 1% lidocaine. Using the modified Seldinger technique, a 5 French sheath was introduced into the right radial artery. 3 mg of verapamil was administered through the sheath, weight-based unfractionated heparin was administered intravenously. A standard Judkins catheters was used for selective coronary angiography. A pigtail catheter was used for left ventriculography. Catheter exchanges were performed over an exchange length guidewire. There were no immediate procedural complications. A TR band was used for radial hemostasis at the completion of the procedure.  The patient was transferred to the post catheterization recovery area for further monitoring.  Procedural Findings:  Hemodynamics: AO:  140/64   mmHg LV:  136/12    mmHg LVEDP: 15  mmHg  Coronary angiography: Coronary dominance: Right   Left Main:  Normal in size with mild 10-20% ostial stenosis.  Left Anterior Descending (LAD):  Normal in size. A stent is noted in the midsegment which is patent with minimal restenosis. Just before the stent, there is a 20% discrete stenosis. Also distal to the stent there is a 20% stenosis. The rest of vessel has minor irregularities.  1st diagonal (D1):  Normal in size and free of significant disease.  2nd diagonal (D2):  Medium in size with minor irregularities.   3rd diagonal (D3):  Small in size.  Circumflex (LCx):  Normal in size and nondominant. It has minor irregularities without evidence of obstructive disease.  1st obtuse marginal:  Very small in size.  2nd obtuse  marginal:  Normal in size and free of significant disease.  3rd obtuse marginal:  Normal in size and free of significant disease.   Ramus Intermedius:  The size with minor akinetic this  Right Coronary Artery: Normal in size and dominant. There is a 50% tubular stenosis proximally. The rest of the vessel has minor irregularities without obstructive disease.  posterior descending artery: Normal in size and free of significant disease.  Left ventriculography: Left ventricular systolic function is mildly reduced , LVEF is estimated at 45 %, there is no significant mitral regurgitation   Final Conclusions:   1. Patent stent in the left anterior descending artery without evidence of obstructive disease. 2. Mildly reduced LV systolic function with global hypokinesis. EF is 45% due to a nonischemic cardiomyopathy. 3. Persistent bradycardia throughout the case with a heart rate of 37 beats per minute.   Recommendations:  The patient has no evidence of obstructive coronary artery disease undergone cardiac cath. He continues to have significant exertional dyspnea and fatigue.  he was evaluated by pulmonary and was found to have only mild COPD which does not explain his symptoms. I do think that persistent bradycardia is likely contributing to his symptoms. He did have evidence of chronotropic incompetence last year during a stress test it. Thus, I recommended a pacemaker placement. An EP consult will be requested.  Lorine Bears MD, [facility] 05/15/2011, 11:24 AM

## 2011-05-15 NOTE — H&P (View-Only) (Signed)
HPI  This is a 51 year old man who is here today for a followup visit. She has known history of coronary artery disease status post angioplasty and drug-eluting stent placement to the left anterior descending artery in March of 2012. He had a 95% stenosis at that time. He had significant improvement in his symptoms after the stent placement. However, over the last few months the patient describes recurrent exertional dyspnea and chest pain similar to the symptoms before his PCI. He gets a tightness feeling with minimal activities associated with dyspnea. He does have history of chronic bradycardia but that actually improved after his PCI. He is having problems with bradycardia again. His heart rate on the ECG today is 38 beats per minute. He did quit smoking last year and gained weight. He has seen by pulmonary but his testing only showed mild COPD.  Allergies  Allergen Reactions  . Codeine     rash  . Ibuprofen     Upset stomach  . Penicillins     rash     Current Outpatient Prescriptions on File Prior to Visit  Medication Sig Dispense Refill  . acetaminophen (TYLENOL) 325 MG tablet Take 650 mg by mouth every 6 (six) hours as needed.        . ALBUTEROL IN Inhale 2 puffs into the lungs as needed.        . ALPRAZolam (XANAX) 0.5 MG tablet Take 0.5 mg by mouth 3 (three) times daily.        Marland Kitchen aspirin EC 81 MG tablet Take 1 tablet (81 mg total) by mouth daily.  150 tablet  2  . clopidogrel (PLAVIX) 75 MG tablet Take 1 tablet (75 mg total) by mouth daily.  30 tablet  5  . escitalopram (LEXAPRO) 20 MG tablet Take 20 mg by mouth daily.        . fluticasone (FLONASE) 50 MCG/ACT nasal spray 2 sprays by Nasal route daily as needed.        . methadone (DOLOPHINE) 10 MG tablet Take 10 mg by mouth 4 (four) times daily. 3 tablets three times per day      . Mometasone Furo-Formoterol Fum (DULERA) 200-5 MCG/ACT AERO Inhale 3 puffs into the lungs 2 (two) times daily.      . nitroGLYCERIN (NITROSTAT) 0.4 MG  SL tablet Place 0.4 mg under the tongue every 5 (five) minutes as needed.        Marland Kitchen olmesartan-hydrochlorothiazide (BENICAR HCT) 20-12.5 MG per tablet Take 1 tablet by mouth daily.      Marland Kitchen omeprazole (PRILOSEC) 20 MG capsule Take 20 mg by mouth daily before breakfast.       . QUEtiapine (SEROQUEL) 50 MG tablet Take 100 mg by mouth at bedtime. To take 2-3 tablets at hs         Past Medical History  Diagnosis Date  . Heart murmur   . COPD (chronic obstructive pulmonary disease)   . Bradycardia     chronic  . SOB (shortness of breath)   . Coronary artery disease april 2010    cardiac cath  . Gastric ulcer with perforation 2002  . Arthritis   . Anxiety and depression   . Hyperlipidemia   . Hypertension   . Sinus bradycardia      Past Surgical History  Procedure Date  . Knee surgery   . Hernia repair   . Cardiac catheterization 06/2008    At [facility]. Mild LAD stenosis  . Cardiac catheterization 05/2010  95% mid LAD stenosis, 20% RCA stenosis  . Varicose vein surgery   . Tonsillectomy   . Coronary angioplasty with stent placement 05/2010    Mid LAD: DES: 3.0 X23 mm Promus     Family History  Problem Relation Age of Onset  . Heart failure Father 78    CHF  . Brain cancer Father   . Lung cancer Father   . Bone cancer Father   . Prostate cancer Paternal Uncle      History   Social History  . Marital Status: Married    Spouse Name: N/A    Number of Children: 3  . Years of Education: N/A   Occupational History  . disabled    Social History Main Topics  . Smoking status: Former Smoker -- 2.0 packs/day for 25 years    Types: Cigarettes, Cigars    Quit date: 08/10/2010  . Smokeless tobacco: Not on file   Comment: cigar occasionally  . Alcohol Use: No  . Drug Use: No  . Sexually Active: Not on file   Other Topics Concern  . Not on file   Social History Narrative  . No narrative on file     PHYSICAL EXAM   BP 130/72  Pulse 57  Resp 18  Ht 6\' 1"  (1.854  m)  Wt 224 lb (101.606 kg)  BMI 29.55 kg/m2  SpO2 92%  Constitutional: He is oriented to person, place, and time. He appears well-developed and well-nourished. No distress.  HENT: No nasal discharge.  Head: Normocephalic and atraumatic.  Eyes: Pupils are equal and round. Right eye exhibits no discharge. Left eye exhibits no discharge.  Neck: Normal range of motion. Neck supple. No JVD present. No thyromegaly present.  Cardiovascular: Normal rate, regular rhythm, normal heart sounds and. Exam reveals no gallop and no friction rub. No murmur heard.  Pulmonary/Chest: Effort normal and breath sounds normal. No stridor. No respiratory distress. He has no wheezes. He has no rales. He exhibits no tenderness.  Abdominal: Soft. Bowel sounds are normal. He exhibits no distension. There is no tenderness. There is no rebound and no guarding.  Musculoskeletal: Normal range of motion. He exhibits no edema and no tenderness.  Neurological: He is alert and oriented to person, place, and time. Coordination normal.  Skin: Skin is warm and dry. No rash noted. He is not diaphoretic. No erythema. No pallor.  Psychiatric: He has a normal mood and affect. His behavior is normal. Judgment and thought content normal.  The right radial pulse is very faint. Left radial pulse is normal.    EKG: Marked sinus bradycardia with a heart rate of 38 beats per minute. No evidence of AV block or bundle branch blocks. No evidence of ischemia.   ASSESSMENT AND PLAN

## 2011-05-17 ENCOUNTER — Encounter: Payer: Self-pay | Admitting: *Deleted

## 2011-05-17 ENCOUNTER — Encounter: Payer: Self-pay | Admitting: Internal Medicine

## 2011-05-17 ENCOUNTER — Ambulatory Visit (INDEPENDENT_AMBULATORY_CARE_PROVIDER_SITE_OTHER): Payer: Medicaid Other | Admitting: Internal Medicine

## 2011-05-17 VITALS — BP 90/40 | HR 34 | Ht 73.0 in | Wt 223.0 lb

## 2011-05-17 DIAGNOSIS — I498 Other specified cardiac arrhythmias: Secondary | ICD-10-CM

## 2011-05-17 DIAGNOSIS — R001 Bradycardia, unspecified: Secondary | ICD-10-CM

## 2011-05-17 DIAGNOSIS — I2510 Atherosclerotic heart disease of native coronary artery without angina pectoris: Secondary | ICD-10-CM

## 2011-05-17 LAB — CBC WITH DIFFERENTIAL/PLATELET
Basophils Relative: 1.1 % (ref 0.0–3.0)
Eosinophils Relative: 3.5 % (ref 0.0–5.0)
HCT: 40.5 % (ref 39.0–52.0)
Hemoglobin: 13.5 g/dL (ref 13.0–17.0)
Lymphs Abs: 2.1 10*3/uL (ref 0.7–4.0)
MCV: 95.5 fl (ref 78.0–100.0)
Monocytes Absolute: 0.6 10*3/uL (ref 0.1–1.0)
Neutro Abs: 2.1 10*3/uL (ref 1.4–7.7)
Platelets: 118 10*3/uL — ABNORMAL LOW (ref 150.0–400.0)
WBC: 5 10*3/uL (ref 4.5–10.5)

## 2011-05-17 LAB — BASIC METABOLIC PANEL
BUN: 16 mg/dL (ref 6–23)
Chloride: 102 mEq/L (ref 96–112)
Potassium: 4.6 mEq/L (ref 3.5–5.1)
Sodium: 138 mEq/L (ref 135–145)

## 2011-05-17 LAB — PROTIME-INR: Prothrombin Time: 9.5 s — ABNORMAL LOW (ref 10.2–12.4)

## 2011-05-17 NOTE — Assessment & Plan Note (Signed)
His bradycardia is worsening and he is more symptomatic. I have recommended proceeding with PPM. The risk/benefits/goals/and expectations of the procedure have been discussed with the patient and he wishes to proceed.

## 2011-05-17 NOTE — Patient Instructions (Signed)
Your physician recommends that you schedule a follow-up appointment in: 2 WEEKS AFTER 05-31-11 FOR WOUND CHECK  Your physician recommends that you schedule a follow-up appointment in: 12 WEEKS AFTER 05-31-11 WITH DR Ladona Ridgel  Your physician recommends that you return for lab work in: TODAY  Your physician has recommended that you have a pacemaker inserted. A pacemaker is a small device that is placed under the skin of your chest or abdomen to help control abnormal heart rhythms. This device uses electrical pulses to prompt the heart to beat at a normal rate. Pacemakers are used to treat heart rhythms that are too slow. Wire (leads) are attached to the pacemaker that goes into the chambers of you heart. This is done in the hospital and usually requires and overnight stay. Please see the instruction sheet given to you today for more information.

## 2011-05-17 NOTE — Progress Notes (Signed)
HPI Ryan Quinn is referred today by Dr. Arrida for evaluation of bradycardia. He is a pleasant 50 yo man with a h/o CAD and COPD. He has fairly marked sinus bradycardia on no bradycardia producing meds. He has not had frank syncope but does get dizzy. He has dyspnea with any exertion despite preserved LV function. He does have a family history of bradycardia. Allergies  Allergen Reactions  . Codeine     rash  . Ibuprofen     Upset stomach  . Penicillins     rash     Current Outpatient Prescriptions  Medication Sig Dispense Refill  . acetaminophen (TYLENOL) 325 MG tablet Take 650 mg by mouth every 6 (six) hours as needed.       . ALBUTEROL IN Inhale 2 puffs into the lungs as needed.        . ALPRAZolam (XANAX) 0.5 MG tablet Take 0.5 mg by mouth 3 (three) times daily.        . aspirin EC 81 MG tablet Take 1 tablet (81 mg total) by mouth daily.  150 tablet  2  . clopidogrel (PLAVIX) 75 MG tablet Take 1 tablet (75 mg total) by mouth daily.  30 tablet  5  . escitalopram (LEXAPRO) 20 MG tablet Take 20 mg by mouth daily.        . fluticasone (FLONASE) 50 MCG/ACT nasal spray 2 sprays by Nasal route daily as needed.        . methadone (DOLOPHINE) 10 MG tablet Take 10 mg by mouth 4 (four) times daily. 4 tablets bid      . Mometasone Furo-Formoterol Fum (DULERA) 200-5 MCG/ACT AERO Inhale 3 puffs into the lungs 2 (two) times daily.      . nitroGLYCERIN (NITROSTAT) 0.4 MG SL tablet Place 0.4 mg under the tongue every 5 (five) minutes as needed.        . olmesartan-hydrochlorothiazide (BENICAR HCT) 20-12.5 MG per tablet Take 1 tablet by mouth daily.      . omeprazole (PRILOSEC) 20 MG capsule Take 20 mg by mouth daily before breakfast.       . QUEtiapine (SEROQUEL) 50 MG tablet Take 100 mg by mouth at bedtime. To take 2-3 tablets at hs as needed      . simvastatin (ZOCOR) 40 MG tablet Take 40 mg by mouth every evening.         Past Medical History  Diagnosis Date  . Heart murmur   . COPD (chronic  obstructive pulmonary disease)   . Bradycardia     chronic  . SOB (shortness of breath)   . Coronary artery disease april 2010    cardiac cath  . Gastric ulcer with perforation 2002  . Arthritis   . Anxiety and depression   . Hyperlipidemia   . Hypertension   . Sinus bradycardia   . BRADYCARDIA, CHRONIC 04/23/2010    ROS:   All systems reviewed and negative except as noted in the HPI.   Past Surgical History  Procedure Date  . Knee surgery   . Hernia repair   . Cardiac catheterization 06/2008    At [facility]. Mild LAD stenosis  . Cardiac catheterization 05/2010    95% mid LAD stenosis, 20% RCA stenosis  . Varicose vein surgery   . Tonsillectomy   . Coronary angioplasty with stent placement 05/2010    Mid LAD: DES: 3.0 X23 mm Promus     Family History  Problem Relation Age of Onset  . Heart failure   Father 57    CHF  . Brain cancer Father   . Lung cancer Father   . Bone cancer Father   . Prostate cancer Paternal Uncle      History   Social History  . Marital Status: Married    Spouse Name: N/A    Number of Children: 3  . Years of Education: N/A   Occupational History  . disabled    Social History Main Topics  . Smoking status: Current Everyday Smoker -- 2.0 packs/day for 25 years    Types: Cigarettes, Cigars    Last Attempt to Quit: 08/10/2010  . Smokeless tobacco: Not on file   Comment: cigar occasionally  . Alcohol Use: No  . Drug Use: No  . Sexually Active: Not on file   Other Topics Concern  . Not on file   Social History Narrative  . No narrative on file     BP 90/40  Pulse 34  Ht 6' 1" (1.854 m)  Wt 101.152 kg (223 lb)  BMI 29.42 kg/m2  Physical Exam:  Well appearing middle aged man, NAD HEENT: Unremarkable Neck:  No JVD, no thyromegally Lymphatics:  No adenopathy Back:  No CVA tenderness Lungs:  Clear HEART:  Regular bradycardic rhythm, no murmurs, no rubs, no clicks Abd:  soft, positive bowel sounds, no organomegally, no  rebound, no guarding Ext:  2 plus pulses, no edema, no cyanosis, no clubbing Skin:  No rashes no nodules Neuro:  CN II through XII intact, motor grossly intact  EKG Marked sinus bradycardia  Assess/Plan:   

## 2011-05-17 NOTE — Assessment & Plan Note (Signed)
He is s/p stenting one year ago. I will have the patient hold his plavix for 2 days prior to PPM implant.

## 2011-05-21 ENCOUNTER — Other Ambulatory Visit: Payer: Self-pay | Admitting: *Deleted

## 2011-05-21 DIAGNOSIS — R001 Bradycardia, unspecified: Secondary | ICD-10-CM

## 2011-05-27 ENCOUNTER — Ambulatory Visit (HOSPITAL_COMMUNITY): Payer: Medicaid Other

## 2011-05-30 MED ORDER — SODIUM CHLORIDE 0.9 % IR SOLN
80.0000 mg | Status: DC
  Filled 2011-05-30: qty 2

## 2011-05-30 MED ORDER — SODIUM CHLORIDE 0.9 % IV SOLN
1500.0000 mg | INTRAVENOUS | Status: DC
  Filled 2011-05-30: qty 1500

## 2011-05-31 ENCOUNTER — Encounter (HOSPITAL_COMMUNITY): Payer: Self-pay | Admitting: *Deleted

## 2011-05-31 ENCOUNTER — Encounter (HOSPITAL_COMMUNITY)
Admission: RE | Disposition: A | Discharge status: Home / Self Care | Payer: Self-pay | Source: Ambulatory Visit | Attending: Internal Medicine

## 2011-05-31 ENCOUNTER — Ambulatory Visit (HOSPITAL_COMMUNITY)
Admission: RE | Admit: 2011-05-31 | Discharge: 2011-06-01 | Disposition: A | Discharge status: Home / Self Care | Payer: Medicaid Other | Source: Ambulatory Visit | Attending: Internal Medicine | Admitting: Internal Medicine

## 2011-05-31 DIAGNOSIS — I2510 Atherosclerotic heart disease of native coronary artery without angina pectoris: Secondary | ICD-10-CM | POA: Insufficient documentation

## 2011-05-31 DIAGNOSIS — J449 Chronic obstructive pulmonary disease, unspecified: Secondary | ICD-10-CM | POA: Insufficient documentation

## 2011-05-31 DIAGNOSIS — I498 Other specified cardiac arrhythmias: Secondary | ICD-10-CM

## 2011-05-31 DIAGNOSIS — J4489 Other specified chronic obstructive pulmonary disease: Secondary | ICD-10-CM | POA: Insufficient documentation

## 2011-05-31 DIAGNOSIS — R001 Bradycardia, unspecified: Secondary | ICD-10-CM

## 2011-05-31 DIAGNOSIS — I10 Essential (primary) hypertension: Secondary | ICD-10-CM | POA: Insufficient documentation

## 2011-05-31 DIAGNOSIS — E785 Hyperlipidemia, unspecified: Secondary | ICD-10-CM | POA: Insufficient documentation

## 2011-05-31 HISTORY — PX: PERMANENT PACEMAKER INSERTION: SHX5480

## 2011-05-31 LAB — BASIC METABOLIC PANEL
BUN: 15 mg/dL (ref 6–23)
Calcium: 9 mg/dL (ref 8.4–10.5)
GFR calc Af Amer: >90 mL/min (ref >90)
GFR calc non Af Amer: 84 mL/min — ABNORMAL LOW (ref >90)
Glucose, Bld: 109 mg/dL — ABNORMAL HIGH (ref 70–99)
Potassium: 4.6 mEq/L (ref 3.5–5.1)
Sodium: 141 mEq/L (ref 135–145)

## 2011-05-31 LAB — CBC
HCT: 36 % — ABNORMAL LOW (ref 39.0–52.0)
Hemoglobin: 12.1 g/dL — ABNORMAL LOW (ref 13.0–17.0)
MCH: 31.5 pg (ref 26.0–34.0)
MCHC: 33.6 g/dL (ref 30.0–36.0)
RDW: 13.4 % (ref 11.5–15.5)

## 2011-05-31 LAB — SURGICAL PCR SCREEN
MRSA, PCR: NEGATIVE
Staphylococcus aureus: NEGATIVE

## 2011-05-31 SURGERY — PERMANENT PACEMAKER INSERTION
Anesthesia: LOCAL

## 2011-05-31 MED ORDER — ALBUTEROL SULFATE HFA 108 (90 BASE) MCG/ACT IN AERS
2.0000 | INHALATION_SPRAY | Freq: Four times a day (QID) | RESPIRATORY_TRACT | Status: DC | PRN
  Filled 2011-05-31: qty 6.7

## 2011-05-31 MED ORDER — NITROGLYCERIN 0.4 MG SL SUBL: 0.4000 mg | SUBLINGUAL_TABLET | SUBLINGUAL | Status: DC | PRN

## 2011-05-31 MED ORDER — OLMESARTAN MEDOXOMIL-HCTZ 20-12.5 MG PO TABS: 1.0000 | ORAL_TABLET | Freq: Every day | ORAL | Status: DC

## 2011-05-31 MED ORDER — LIDOCAINE HCL (PF) 1 % IJ SOLN
INTRAMUSCULAR | Status: AC
  Filled 2011-05-31: qty 60

## 2011-05-31 MED ORDER — CLOPIDOGREL BISULFATE 75 MG PO TABS
75.0000 mg | ORAL_TABLET | Freq: Every day | ORAL | Status: DC
  Administered 2011-05-31 – 2011-06-01 (×2): 75 mg via ORAL
  Filled 2011-05-31 (×2): qty 1

## 2011-05-31 MED ORDER — ESCITALOPRAM OXALATE 20 MG PO TABS
20.0000 mg | ORAL_TABLET | Freq: Every day | ORAL | Status: DC
  Administered 2011-05-31 – 2011-06-01 (×2): 20 mg via ORAL
  Filled 2011-05-31 (×2): qty 1

## 2011-05-31 MED ORDER — ONDANSETRON HCL 4 MG/2ML IJ SOLN: 4.0000 mg | Freq: Four times a day (QID) | INTRAMUSCULAR | Status: DC | PRN

## 2011-05-31 MED ORDER — ACETAMINOPHEN 325 MG PO TABS: 650.0000 mg | ORAL_TABLET | Freq: Four times a day (QID) | ORAL | Status: DC | PRN

## 2011-05-31 MED ORDER — VANCOMYCIN HCL IN DEXTROSE 1-5 GM/200ML-% IV SOLN
INTRAVENOUS | Status: AC
  Filled 2011-05-31: qty 200

## 2011-05-31 MED ORDER — SIMVASTATIN 40 MG PO TABS
40.0000 mg | ORAL_TABLET | Freq: Every evening | ORAL | Status: DC
  Administered 2011-05-31: 40 mg via ORAL
  Filled 2011-05-31 (×2): qty 1

## 2011-05-31 MED ORDER — VANCOMYCIN HCL 1000 MG IV SOLR
2000.0000 mg | Freq: Once | INTRAVENOUS | Status: AC
  Administered 2011-06-01: 2000 mg via INTRAVENOUS
  Filled 2011-05-31: qty 2000

## 2011-05-31 MED ORDER — METHADONE HCL 10 MG PO TABS
30.0000 mg | ORAL_TABLET | Freq: Three times a day (TID) | ORAL | Status: DC
  Administered 2011-05-31 – 2011-06-01 (×3): 30 mg via ORAL
  Filled 2011-05-31: qty 3
  Filled 2011-05-31: qty 1
  Filled 2011-05-31: qty 3

## 2011-05-31 MED ORDER — ALPRAZOLAM 0.5 MG PO TABS
0.5000 mg | ORAL_TABLET | Freq: Three times a day (TID) | ORAL | Status: DC
  Administered 2011-05-31 – 2011-06-01 (×3): 0.5 mg via ORAL
  Filled 2011-05-31 (×3): qty 1

## 2011-05-31 MED ORDER — ASPIRIN EC 81 MG PO TBEC
81.0000 mg | DELAYED_RELEASE_TABLET | Freq: Every day | ORAL | Status: DC
  Administered 2011-05-31 – 2011-06-01 (×2): 81 mg via ORAL
  Filled 2011-05-31 (×2): qty 1

## 2011-05-31 MED ORDER — MOMETASONE FURO-FORMOTEROL FUM 100-5 MCG/ACT IN AERO: 2.0000 | INHALATION_SPRAY | Freq: Two times a day (BID) | RESPIRATORY_TRACT | Status: DC

## 2011-05-31 MED ORDER — SODIUM CHLORIDE 0.45 % IV SOLN: INTRAVENOUS | Status: DC

## 2011-05-31 MED ORDER — FLUTICASONE PROPIONATE 50 MCG/ACT NA SUSP
2.0000 | Freq: Every day | NASAL | Status: DC | PRN
  Filled 2011-05-31: qty 16

## 2011-05-31 MED ORDER — HEPARIN (PORCINE) IN NACL 2-0.9 UNIT/ML-% IJ SOLN
INTRAMUSCULAR | Status: AC
  Filled 2011-05-31: qty 1000

## 2011-05-31 MED ORDER — MIDAZOLAM HCL 5 MG/5ML IJ SOLN
INTRAMUSCULAR | Status: AC
  Filled 2011-05-31: qty 5

## 2011-05-31 MED ORDER — FENTANYL CITRATE 0.05 MG/ML IJ SOLN
INTRAMUSCULAR | Status: AC
  Filled 2011-05-31: qty 2

## 2011-05-31 MED ORDER — HYDROCHLOROTHIAZIDE 12.5 MG PO CAPS
12.5000 mg | ORAL_CAPSULE | Freq: Every day | ORAL | Status: DC
  Administered 2011-05-31 – 2011-06-01 (×2): 12.5 mg via ORAL
  Filled 2011-05-31 (×2): qty 1

## 2011-05-31 MED ORDER — MUPIROCIN 2 % EX OINT
TOPICAL_OINTMENT | Freq: Once | CUTANEOUS | Status: AC
  Administered 2011-05-31: 07:00:00 via NASAL
  Filled 2011-05-31: qty 22

## 2011-05-31 MED ORDER — SODIUM CHLORIDE 0.9 % IV SOLN
INTRAVENOUS | Status: DC
  Administered 2011-05-31: 07:00:00 via INTRAVENOUS

## 2011-05-31 MED ORDER — QUETIAPINE FUMARATE 100 MG PO TABS
100.0000 mg | ORAL_TABLET | Freq: Every evening | ORAL | Status: DC | PRN
  Filled 2011-05-31: qty 1.5

## 2011-05-31 MED ORDER — IRBESARTAN 150 MG PO TABS
150.0000 mg | ORAL_TABLET | Freq: Every day | ORAL | Status: DC
  Administered 2011-05-31 – 2011-06-01 (×2): 150 mg via ORAL
  Filled 2011-05-31 (×2): qty 1

## 2011-05-31 NOTE — Op Note (Signed)
DDD PPM implanted via the left cephalic vein without immediate complication. [id number].

## 2011-05-31 NOTE — Discharge Instructions (Signed)
    Supplemental Discharge Instructions for  Pacemaker/Defibrillator Patients  Activity No heavy lifting or vigorous activity with your left/right arm for 6 to 8 weeks.  Do not raise your left/right arm above your head for one week.  Gradually raise your affected arm as drawn below.                  March 26 th              March 27 th            March 28 th          March 29 th  NO DRIVING for  1 week    ; you may begin driving on     March 29 th     . WOUND CARE   Keep the wound area clean and dry.  Do not get this area wet for one week. No showers for one week; you may shower on     March 29 th         .   The tape/steri-strips on your wound will fall off; do not pull them off.  No bandage is needed on the site.  DO  NOT apply any creams, oils, or ointments to the wound area.   If you notice any drainage or discharge from the wound, any swelling or bruising at the site, or you develop a fever > 101? F after you are discharged home, call the office at once.  Special Instructions   You are still able to use cellular telephones; use the ear opposite the side where you have your pacemaker/defibrillator.  Avoid carrying your cellular phone near your device.   When traveling through airports, show security personnel your identification card to avoid being screened in the metal detectors.  Ask the security personnel to use the hand wand.   Avoid arc welding equipment, MRI testing (magnetic resonance imaging), TENS units (transcutaneous nerve stimulators).  Call the office for questions about other devices.   Avoid electrical appliances that are in poor condition or are not properly grounded.   Microwave ovens are safe to be near or to operate.  Additional information for defibrillator patients should your device go off:   If your device goes off ONCE and you feel fine afterward, notify the device clinic nurses.   If your device goes off ONCE and you do not feel well afterward, call 911.   If  your device goes off TWICE, call 911.   If your device goes off THREE times in one day, call 911.  DO NOT DRIVE YOURSELF OR A FAMILY MEMBER WITH A DEFIBRILLATOR TO THE HOSPITAL--CALL 911.

## 2011-05-31 NOTE — Interval H&P Note (Signed)
History and Physical Interval Note:  05/31/2011 7:29 AM  Ryan Quinn  has presented today for surgery, with the diagnosis of bradicardia  The various methods of treatment have been discussed with the patient and family. After consideration of risks, benefits and other options for treatment, the patient has consented to  Procedure(s) (LRB): PERMANENT PACEMAKER INSERTION (N/A) as a surgical intervention .  The patients' history has been reviewed, patient examined, no change in status, stable for surgery.  I have reviewed the patients' chart and labs.  Questions were answered to the patient's satisfaction.     Lewayne Bunting

## 2011-05-31 NOTE — H&P (View-Only) (Signed)
HPI [name] is referred today by Dr. Kennith Maes for evaluation of bradycardia. He is a pleasant 51 yo man with a h/o CAD and COPD. He has fairly marked sinus bradycardia on no bradycardia producing meds. He has not had frank syncope but does get dizzy. He has dyspnea with any exertion despite preserved LV function. He does have a family history of bradycardia. Allergies  Allergen Reactions  . Codeine     rash  . Ibuprofen     Upset stomach  . Penicillins     rash     Current Outpatient Prescriptions  Medication Sig Dispense Refill  . acetaminophen (TYLENOL) 325 MG tablet Take 650 mg by mouth every 6 (six) hours as needed.       . ALBUTEROL IN Inhale 2 puffs into the lungs as needed.        . ALPRAZolam (XANAX) 0.5 MG tablet Take 0.5 mg by mouth 3 (three) times daily.        Marland Kitchen aspirin EC 81 MG tablet Take 1 tablet (81 mg total) by mouth daily.  150 tablet  2  . clopidogrel (PLAVIX) 75 MG tablet Take 1 tablet (75 mg total) by mouth daily.  30 tablet  5  . escitalopram (LEXAPRO) 20 MG tablet Take 20 mg by mouth daily.        . fluticasone (FLONASE) 50 MCG/ACT nasal spray 2 sprays by Nasal route daily as needed.        . methadone (DOLOPHINE) 10 MG tablet Take 10 mg by mouth 4 (four) times daily. 4 tablets bid      . Mometasone Furo-Formoterol Fum (DULERA) 200-5 MCG/ACT AERO Inhale 3 puffs into the lungs 2 (two) times daily.      . nitroGLYCERIN (NITROSTAT) 0.4 MG SL tablet Place 0.4 mg under the tongue every 5 (five) minutes as needed.        Marland Kitchen olmesartan-hydrochlorothiazide (BENICAR HCT) 20-12.5 MG per tablet Take 1 tablet by mouth daily.      Marland Kitchen omeprazole (PRILOSEC) 20 MG capsule Take 20 mg by mouth daily before breakfast.       . QUEtiapine (SEROQUEL) 50 MG tablet Take 100 mg by mouth at bedtime. To take 2-3 tablets at hs as needed      . simvastatin (ZOCOR) 40 MG tablet Take 40 mg by mouth every evening.         Past Medical History  Diagnosis Date  . Heart murmur   . COPD (chronic  obstructive pulmonary disease)   . Bradycardia     chronic  . SOB (shortness of breath)   . Coronary artery disease april 2010    cardiac cath  . Gastric ulcer with perforation 2002  . Arthritis   . Anxiety and depression   . Hyperlipidemia   . Hypertension   . Sinus bradycardia   . BRADYCARDIA, CHRONIC 04/23/2010    ROS:   All systems reviewed and negative except as noted in the HPI.   Past Surgical History  Procedure Date  . Knee surgery   . Hernia repair   . Cardiac catheterization 06/2008    At [facility]. Mild LAD stenosis  . Cardiac catheterization 05/2010    95% mid LAD stenosis, 20% RCA stenosis  . Varicose vein surgery   . Tonsillectomy   . Coronary angioplasty with stent placement 05/2010    Mid LAD: DES: 3.0 X23 mm Promus     Family History  Problem Relation Age of Onset  . Heart failure  Father 81    CHF  . Brain cancer Father   . Lung cancer Father   . Bone cancer Father   . Prostate cancer Paternal Uncle      History   Social History  . Marital Status: Married    Spouse Name: N/A    Number of Children: 3  . Years of Education: N/A   Occupational History  . disabled    Social History Main Topics  . Smoking status: Current Everyday Smoker -- 2.0 packs/day for 25 years    Types: Cigarettes, Cigars    Last Attempt to Quit: 08/10/2010  . Smokeless tobacco: Not on file   Comment: cigar occasionally  . Alcohol Use: No  . Drug Use: No  . Sexually Active: Not on file   Other Topics Concern  . Not on file   Social History Narrative  . No narrative on file     BP 90/40  Pulse 34  Ht 6\' 1"  (1.854 m)  Wt 101.152 kg (223 lb)  BMI 29.42 kg/m2  Physical Exam:  Well appearing middle aged man, NAD HEENT: Unremarkable Neck:  No JVD, no thyromegally Lymphatics:  No adenopathy Back:  No CVA tenderness Lungs:  Clear HEART:  Regular bradycardic rhythm, no murmurs, no rubs, no clicks Abd:  soft, positive bowel sounds, no organomegally, no  rebound, no guarding Ext:  2 plus pulses, no edema, no cyanosis, no clubbing Skin:  No rashes no nodules Neuro:  CN II through XII intact, motor grossly intact  EKG Marked sinus bradycardia  Assess/Plan:

## 2011-05-31 NOTE — Op Note (Signed)
NAMEZAFIR, Ryan Quinn NO.:  [id number]  MEDICAL RECORD NO.:  [id number]  LOCATION:  [facility]                         FACILITY:  [facility]  PHYSICIAN:  Doylene Canning. Ladona Ridgel, MD    DATE OF BIRTH:  1960/10/25  DATE OF PROCEDURE:  05/31/2011 DATE OF DISCHARGE:                              OPERATIVE REPORT   PROCEDURE PERFORMED:  Insertion of dual-chamber pacemaker.  INDICATION:  Symptomatic bradycardia.  INTRODUCTION:  The patient is a very pleasant 51 year old man with multiple medical problems including coronary disease as well as severe musculoskeletal pain and arthritis.  He has resting heart rates in the mid 30s and dyspnea with exertion, unable to get his heart rate up into the 70s.  The patient is now referred for dual-chamber pacemaker implantation secondary to symptomatic bradycardia.  PROCEDURE:  After informed consent was obtained, the patient was taken to the diagnostic EP lab in a fasting state.  After usual preparation and draping, intravenous fentanyl and midazolam were given for sedation. 30 mL of lidocaine was infiltrated into the left infraclavicular region. A 5-cm incision was carried out over this region.  Electrocautery was utilized to dissect down to the fascial plane.  Blunt dissection was then utilized to isolate the cephalic vein.  Without difficulty, the St. Jude model 2088T, 58 cm active fixation pacing lead, serial number [id number] was advanced into the right ventricle and the St. Jude model 2088T, 52 cm active fixation pacing lead serial number [id number] was advanced to the right atrium.  Mapping was carried out in the right ventricle and at the final site on the RV apical septum, the R-wave was measured 9 mV, the pacing impedance was 900 ohms and the threshold was around a volt at 0.4 msec.  With these satisfactory parameters, attention was then turned to placement of the atrial lead.  It was placed in the anterolateral wall of the right atrium  where P-wave was measured around 1-1/2 to 2 mV, and the pacing impedance with the lead actively fixed was 600 ohms.  The threshold was initially 1.9 V at 0.4, but subsequently down to 1.25 V at 0.4 msec.  A large injury currents were present with active fixation of both atrial and ventricular leads, and 10 V pacing in each lead did not stimulate the diaphragm.  With the atrial and the ventricular lead in satisfactory position, they were secured with the subpectoral fascia with a figure-of-eight silk suture. Sewing sleeve was secured with silk suture.  Electrocautery was utilized to make a subcutaneous pocket.  Antibiotic irrigation was utilized to irrigate the pocket and electrocautery was utilized to assure hemostasis.  The St. Jude accent DR RF dual-chamber pacemaker, serial number [id number] was connected to the atrial and the RV lead and placed back in the subcutaneous pocket where it was secured with silk suture. The pocket was again irrigated and the incision closed with 2-0 and 3-0 Vicryl.  Benzoin and Steri-Strips were painted on the skin.  A pressure dressing was applied, and the patient was returned to his room in satisfactory condition.  COMPLICATIONS:  There were no immediate procedure complications.  For results, this demonstrate successful implantation of a St.  Jude dual- chamber pacemaker in a patient with symptomatic bradycardia.     Doylene Canning. Ladona Ridgel, MD     GWT/MEDQ  D:  05/31/2011  T:  05/31/2011  Job:  [id number]  cc:   Lorine Bears, MD

## 2011-05-31 NOTE — Progress Notes (Signed)
F/U appts and d/c instruction sheet done. D/C med list started with all home Rx continued.

## 2011-06-01 ENCOUNTER — Ambulatory Visit (HOSPITAL_COMMUNITY): Payer: Medicaid Other

## 2011-06-01 ENCOUNTER — Other Ambulatory Visit: Payer: Self-pay

## 2011-06-01 NOTE — Progress Notes (Signed)
Subjective:   Ryan Quinn is a 51 yo with bradycardia.  Admitted yesterday and had pacer placed.  Doing well  Dressing was removed.  steristrips are fairly dry.      . ALPRAZolam  0.5 mg Oral TID  . aspirin EC  81 mg Oral Daily  . clopidogrel  75 mg Oral Daily  . escitalopram  20 mg Oral Daily  . hydrochlorothiazide  12.5 mg Oral Daily  . irbesartan  150 mg Oral Daily  . methadone  30 mg Oral TID  . mometasone-formoterol  2 puff Inhalation BID  . simvastatin  40 mg Oral QPM  . vancomycin      . vancomycin  2,000 mg Intravenous Once  . DISCONTD: gentamicin irrigation  80 mg Irrigation On Call  . DISCONTD: olmesartan-hydrochlorothiazide  1 tablet Oral Daily  . DISCONTD: vancomycin  1,500 mg Intravenous 60 min Pre-Op      . DISCONTD: sodium chloride    . DISCONTD: sodium chloride 50 mL/hr at 05/31/11 8119    Objective:  Vital Signs in the last 24 hours: Blood pressure 149/91, pulse 60, temperature 97.7 F (36.5 C), temperature source Oral, resp. rate 20, height 6\' 1"  (1.854 m), weight 220 lb 0.3 oz (99.8 kg), SpO2 95.00%. Temp:  [97.7 F (36.5 C)-98.3 F (36.8 C)] 97.7 F (36.5 C) (03/23 0423) Pulse Rate:  [60] 60  (03/23 0423) Resp:  [18-20] 20  (03/23 0423) BP: (100-149)/(68-91) 149/91 mmHg (03/23 0423) SpO2:  [94 %-96 %] 95 % (03/23 0423) Weight:  [220 lb 0.3 oz (99.8 kg)] 220 lb 0.3 oz (99.8 kg) (03/23 0423)  Intake/Output from previous day:   Intake/Output from this shift: Total I/O In: 240 [P.O.:240] Out: -   Physical Exam:  Physical Exam: Blood pressure 149/91, pulse 60, temperature 97.7 F (36.5 C), temperature source Oral, resp. rate 20, height 6\' 1"  (1.854 m), weight 220 lb 0.3 oz (99.8 kg), SpO2 95.00%. General: Well developed, well nourished, in no acute distress. Head: Normocephalic, atraumatic, sclera non-icteric, mucus membranes are moist, Neck: Supple. Normal carotids. No JVD Lungs: Clear bilaterally to auscultation without wheezes, rales, or  rhonchi. Breathing is unlabored. Heart: Regular rate,  With normal  S1 S2. No murmurs, rubs, or gallops . Pacer in left subclavian.  Steri strips are slightly blood tinged.  Otherwise clean and dry. Abdomen: Soft, non-tender, non-distended with normoactive bowel sounds. No hepatomegaly. No rebound/guarding. No abdominal masses. Msk:  Strength and tone appear normal for age. Extremities: No clubbing or cyanosis. No edema.  Distal pedal pulses are 2+ and equal bilaterally. Neuro: Alert and oriented X 3. Moves all extremities spontaneously. Psych:  Responds to questions appropriately with a normal affect.    Lab Results:   [facility] 05/31/11 0644  NA 141  K 4.6  CL 103  CO2 31  GLUCOSE 109*  BUN 15  CREATININE 1.03  CALCIUM 9.0  MG --  PHOS --   No results found for this basename: AST:2,ALT:2,ALKPHOS:2,BILITOT:2,PROT:2,ALBUMIN:2 in the last 72 hours No results found for this basename: LIPASE:2,AMYLASE:2 in the last 72 hours  Basename 05/31/11 0644  WBC 5.5  NEUTROABS --  HGB 12.1*  HCT 36.0*  MCV 93.8  PLT 123*    Tele: A-Paced.  Assessment/Plan:   1. Bradycardia: s/p pacer.  Site looks good.  CXR shows no pneumothorax.  Will go to device clinic in 2 weeks and will see Dr.Taylor in 3 months.   Disposition: home today after final dose of vanco  Loistine Chance  Barbaraann Rondo., MD, [facility] 06/01/2011, 11:20 AM LOS: Day 1

## 2011-06-01 NOTE — Progress Notes (Signed)
Pt IV removed, no bleeding noted. Pressure held. Discharge instructions given to pt and wife. Pt ready for discharge with wife.

## 2011-06-01 NOTE — Discharge Summary (Signed)
Physician Discharge Summary  Patient ID: Ryan Quinn MRN: [id number] DOB/AGE: [birth year] 51 y.o.  Admit date: 05/31/2011 Discharge date: 06/01/2011  Primary Discharge Diagnosis: Symptomatic Bradycardia Secondary Discharge Diagnosis 1. S/P  [employer] Jude accent DR RF dual-chamber pacemaker, serial  number [id number] 2. CAD: Prior PCI with DES to the LAD 04/2010 3. Mild COPD Significant Diagnostic Studies: None  Consults: None  Hospital Course: Mr. Fedora is a 51 year old patient of Dr. Mady Gemma with known history of coronary artery disease status post drug-eluting stent to his left anterior descending artery in March of 2002 secondary to 95% stenosis. The patient was continuing to complain of shortness of breath and exertional dyspnea with some discomfort similar to symptoms prior to PCI. It was noted that the patient had significant bradycardia, which was apparent prior to his LAD stent and had transient, improvement, we will after PCI. However, he began to have symptomatic bradycardia and was seen by Dr. Kirke Corin 05/09/2011. He was scheduled to have a cardiac catheterization, which was completed by Dr. Lucillie Garfinkel on 05/15/2011 to evaluate for restenosis of his LAD or progression of known CAD. LAD stent in the left anterior distending artery with a patent was found to have some mildly reduced LV systolic function with global hypokinesis. The EF was 45% and do 2 nonischemic cardiopathy. It was noted that he had persistent bradycardia throughout the case with a heart rate of 37 beats per minute. He was referred to Dr. Sharrell Ku electrophysiologist for discussion of need for implantation of permanent pacemaker.    He was seen by Dr. Ladona Ridgel and discussion was had for need for permanent pacemaker with risks and benefits discussed. The patient was willing to proceed and was admitted on 05/31/2011 for procedure.St Jude Accent DR RF dual chamber pacemaker, Serial number [id number], was placed. He tolerated procedure  well without complications. He was seen and examined by Dr. Kristeen Miss on discharge and found to be stable to return home. His medications have not changed, and he will continue all medications. He was taking prior to admission. He has followup appointments already scheduled for pacemaker clinic and with Dr. Ladona Ridgel. He has been given post-pacemaker instructions and a written and verbal form.   Discharge Vitals: Blood pressure 149/91, pulse 60, temperature 97.7 F (36.5 C), temperature source Oral, resp. rate 20, height 6\' 1"  (1.854 m), weight 220 lb 0.3 oz (99.8 kg), SpO2 95.00%.Labs:   Lab Results  Component Value Date   WBC 5.5 05/31/2011   HGB 12.1* 05/31/2011   HCT 36.0* 05/31/2011   MCV 93.8 05/31/2011   PLT 123* 05/31/2011    Lab 05/31/11 0644  NA 141  K 4.6  CL 103  CO2 31  BUN 15  CREATININE 1.03  CALCIUM 9.0  PROT --  BILITOT --  ALKPHOS --  ALT --  AST --  GLUCOSE 109*       Radiology: Dg Chest 2 View  06/01/2011  *RADIOLOGY REPORT*  Clinical Data: Status post pacemaker placement.  CHEST - 2 VIEW  Comparison: PA and lateral chest 02/12/2011.  Findings: The patient has a new right subclavian approach pacing device.  Tip of the proximal lead is in the right atrium with the distal lead in the apex of the right ventricle.  There is no pneumothorax.  Heart size is normal.  Lungs are clear.  IMPRESSION: New pacemaker in place without evidence of complication.  No acute finding.  Original Report Authenticated By: Bernadene Bell. Maricela Curet, M.D.  [id number]:[id number] rhythm  FOLLOW UP PLANS AND APPOINTMENTS Discharge Orders    Future Appointments: Provider: Department: Dept Phone: Center:   06/13/2011 10:30 AM [facility] [phone] [facility]   08/16/2011 10:30 AM Marinus Maw, MD [facility] [phone] [facility]     Medication List  As of 06/01/2011 12:24 PM   TAKE these medications         acetaminophen 325 MG tablet   Commonly known as:  TYLENOL   Take 650 mg by mouth every 6 (six) hours as needed. For pain      albuterol 108 (90 BASE) MCG/ACT inhaler   Commonly known as: PROVENTIL HFA;VENTOLIN HFA   Inhale 2 puffs into the lungs every 6 (six) hours as needed. For shortness of breath      ALPRAZolam 0.5 MG tablet   Commonly known as: XANAX   Take 0.5 mg by mouth 3 (three) times daily.      aspirin EC 81 MG tablet   Take 1 tablet (81 mg total) by mouth daily.      clopidogrel 75 MG tablet   Commonly known as: PLAVIX   Take 1 tablet (75 mg total) by mouth daily.      DULERA 200-5 MCG/ACT Aero   Generic drug: Mometasone Furo-Formoterol Fum   Inhale 3 puffs into the lungs 2 (two) times daily.      escitalopram 20 MG tablet   Commonly known as: LEXAPRO   Take 20 mg by mouth daily.      fluticasone 50 MCG/ACT nasal spray   Commonly known as: FLONASE   Place 2 sprays into the nose daily as needed. For allergies      methadone 10 MG tablet   Commonly known as: DOLOPHINE   Take 30 mg by mouth 3 (three) times daily.      nitroGLYCERIN 0.4 MG SL tablet   Commonly known as: NITROSTAT   Place 0.4 mg under the tongue every 5 (five) minutes as needed. For chest pain      olmesartan-hydrochlorothiazide 20-12.5 MG per tablet   Commonly known as: BENICAR HCT   Take 1 tablet by mouth daily.      omeprazole 20 MG capsule   Commonly known as: PRILOSEC   Take 20 mg by mouth daily before breakfast.      QUEtiapine 50 MG tablet   Commonly known as: SEROQUEL   Take 100-150 mg by mouth at bedtime. To take 2-3 tablets at bedtime as needed      simvastatin 40 MG tablet   Commonly known as: ZOCOR   Take 40 mg by mouth every evening.           Follow-up Information    Follow up with [facility]. (April 4th at 10:30 am)       Follow up with Lewayne Bunting, MD. (June 7th at 10:30am)    Contact information:   [address] [place] [address] [phone]              Time spent with patient to include physician time:35 minutes Signed: Joni Reining 06/01/2011, 12:24 PM Co-Sign MD Attending Note:   The patient was seen and examined.  Agree with assessment and plan as noted above.  See my note from earlier the same day.  Vesta Mixer, Montez Hageman., MD, [facility] 06/02/2011, 8:17 AM

## 2011-06-13 ENCOUNTER — Encounter: Payer: Self-pay | Admitting: Internal Medicine

## 2011-06-13 ENCOUNTER — Ambulatory Visit (INDEPENDENT_AMBULATORY_CARE_PROVIDER_SITE_OTHER): Payer: Medicaid Other | Admitting: *Deleted

## 2011-06-13 DIAGNOSIS — I498 Other specified cardiac arrhythmias: Secondary | ICD-10-CM

## 2011-06-13 LAB — PACEMAKER DEVICE OBSERVATION
AL AMPLITUDE: 5 mv
AL IMPEDENCE PM: 550 Ohm
AL THRESHOLD: 0.625 V
BAMS-0001: 180 {beats}/min
BAMS-0003: 70 {beats}/min
BATTERY VOLTAGE: 3.2186 V
DEVICE MODEL PM: 7334998
RV LEAD AMPLITUDE: 12 mv
RV LEAD IMPEDENCE PM: 525 Ohm
RV LEAD THRESHOLD: 0.5 V

## 2011-06-13 NOTE — Progress Notes (Signed)
Wound check-PPM 

## 2011-06-26 ENCOUNTER — Telehealth: Payer: Self-pay | Admitting: Internal Medicine

## 2011-06-26 NOTE — Telephone Encounter (Signed)
New Problem:     Patient called in confused about what his next appointments should be and when he should go back to see Dr. Sherene Sires and when he should see Dr. Kirke Corin and he is still having trouble breathing and wanted to know if he needs to have a CPX. Please call back and feel free to leave a message.

## 2011-06-26 NOTE — Telephone Encounter (Signed)
Pt was to do a CPX test for Dr Sherene Sires.  It was canceled do to new PPM implant  Dr Ladona Ridgel says he can go ahead and have CPX

## 2011-08-16 ENCOUNTER — Encounter: Payer: Medicaid Other | Admitting: Internal Medicine

## 2011-09-02 ENCOUNTER — Other Ambulatory Visit: Payer: Self-pay | Admitting: Cardiology

## 2011-09-02 NOTE — Telephone Encounter (Signed)
..   Requested Prescriptions   Pending Prescriptions Disp Refills  . clopidogrel (PLAVIX) 75 MG tablet [Pharmacy Med Name: CLOPIDOGREL BISULFATE 75 MG TAB AURO] 30 tablet 3    Sig: TAKE ONE TABLET BY MOUTH ONE TIME DAILY

## 2011-09-06 ENCOUNTER — Encounter: Payer: Self-pay | Admitting: Internal Medicine

## 2011-09-06 ENCOUNTER — Ambulatory Visit (INDEPENDENT_AMBULATORY_CARE_PROVIDER_SITE_OTHER): Payer: Medicaid Other | Admitting: Internal Medicine

## 2011-09-06 VITALS — BP 162/92 | HR 62 | Ht 73.0 in | Wt 214.8 lb

## 2011-09-06 DIAGNOSIS — I2510 Atherosclerotic heart disease of native coronary artery without angina pectoris: Secondary | ICD-10-CM

## 2011-09-06 DIAGNOSIS — I498 Other specified cardiac arrhythmias: Secondary | ICD-10-CM

## 2011-09-06 DIAGNOSIS — Z950 Presence of cardiac pacemaker: Secondary | ICD-10-CM

## 2011-09-06 LAB — PACEMAKER DEVICE OBSERVATION
ATRIAL PACING PM: 83
BAMS-0001: 180 {beats}/min
BAMS-0003: 70 {beats}/min
BATTERY VOLTAGE: 2.993 V
VENTRICULAR PACING PM: 2.5

## 2011-09-06 NOTE — Patient Instructions (Signed)
Your physician wants you to follow-up in: March 2014 You will receive a reminder letter in the mail two months in advance. If you don't receive a letter, please call our office to schedule the follow-up appointment.  

## 2011-09-08 ENCOUNTER — Encounter: Payer: Self-pay | Admitting: Internal Medicine

## 2011-09-08 DIAGNOSIS — Z950 Presence of cardiac pacemaker: Secondary | ICD-10-CM

## 2011-09-08 DIAGNOSIS — I2510 Atherosclerotic heart disease of native coronary artery without angina pectoris: Secondary | ICD-10-CM | POA: Insufficient documentation

## 2011-09-08 HISTORY — DX: Presence of cardiac pacemaker: Z95.0

## 2011-09-08 NOTE — Progress Notes (Signed)
HPI [name] returns today for followup. He is a pleasant 51 yo man with symptomatic bradycardia, CAD, s/p PCI, and is s/p PPM. He feels better. No chest pain or sob. His energy level has improved. Allergies  Allergen Reactions  . Codeine     rash  . Ibuprofen     Upset stomach  . Penicillins     rash     Current Outpatient Prescriptions  Medication Sig Dispense Refill  . acetaminophen (TYLENOL) 325 MG tablet Take 650 mg by mouth every 6 (six) hours as needed. For pain      . albuterol (PROVENTIL HFA;VENTOLIN HFA) 108 (90 BASE) MCG/ACT inhaler Inhale 2 puffs into the lungs every 6 (six) hours as needed. For shortness of breath      . ALPRAZolam (XANAX) 0.5 MG tablet Take 0.5 mg by mouth 3 (three) times daily.        . clopidogrel (PLAVIX) 75 MG tablet TAKE ONE TABLET BY MOUTH ONE TIME DAILY  30 tablet  3  . DIOVAN HCT 320-12.5 MG per tablet Take 1 tablet by mouth daily.       Marland Kitchen escitalopram (LEXAPRO) 20 MG tablet Take 20 mg by mouth daily.        . fluticasone (FLONASE) 50 MCG/ACT nasal spray Place 2 sprays into the nose daily as needed. For allergies      . methadone (DOLOPHINE) 10 MG tablet Take 30 mg by mouth 3 (three) times daily.       . Mometasone Furo-Formoterol Fum (DULERA) 200-5 MCG/ACT AERO Inhale 3 puffs into the lungs 2 (two) times daily.      . nitroGLYCERIN (NITROSTAT) 0.4 MG SL tablet Place 0.4 mg under the tongue every 5 (five) minutes as needed. For chest pain      . omeprazole (PRILOSEC) 20 MG capsule Take 20 mg by mouth daily before breakfast.       . QUEtiapine (SEROQUEL) 50 MG tablet Take 100-150 mg by mouth at bedtime. To take 2-3 tablets at bedtime as needed      . simvastatin (ZOCOR) 40 MG tablet Take 40 mg by mouth every evening.      Marland Kitchen DISCONTD: ALBUTEROL IN Inhale 2 puffs into the lungs as needed.           Past Medical History  Diagnosis Date  . Heart murmur   . COPD (chronic obstructive pulmonary disease)   . Bradycardia     chronic  . SOB  (shortness of breath)   . Coronary artery disease april 2010    cardiac cath  . Gastric ulcer with perforation 2002  . Arthritis   . Anxiety and depression   . Hyperlipidemia   . Hypertension   . Sinus bradycardia   . BRADYCARDIA, CHRONIC 04/23/2010    ROS:   All systems reviewed and negative except as noted in the HPI.   Past Surgical History  Procedure Date  . Knee surgery   . Hernia repair   . Cardiac catheterization 06/2008    At [facility]. Mild LAD stenosis  . Cardiac catheterization 05/2010    95% mid LAD stenosis, 20% RCA stenosis  . Varicose vein surgery   . Tonsillectomy   . Coronary angioplasty with stent placement 05/2010    Mid LAD: DES: 3.0 X23 mm Promus     Family History  Problem Relation Age of Onset  . Heart failure Father 25    CHF  . Brain cancer Father   . Lung cancer Father   .  Bone cancer Father   . Prostate cancer Paternal Uncle      History   Social History  . Marital Status: Married    Spouse Name: N/A    Number of Children: 3  . Years of Education: N/A   Occupational History  . disabled    Social History Main Topics  . Smoking status: Current Everyday Smoker -- 2.0 packs/day for 25 years    Types: Cigarettes, Cigars    Last Attempt to Quit: 08/10/2010  . Smokeless tobacco: Not on file   Comment: cigar occasionally  . Alcohol Use: No  . Drug Use: No  . Sexually Active: Not on file   Other Topics Concern  . Not on file   Social History Narrative  . No narrative on file     BP 162/92  Pulse 62  Ht 6\' 1"  (1.854 m)  Wt 214 lb 12.8 oz (97.433 kg)  BMI 28.34 kg/m2  Physical Exam:  Well appearing middle aged man, NAD HEENT: Unremarkable Neck:  No JVD, no thyromegally Lungs:  Clear with no wheezes, rales, or rhonchi. Well healed PPM incision. HEART:  Regular rate rhythm, no murmurs, no rubs, no clicks Abd:  soft, positive bowel sounds, no organomegally, no rebound, no guarding Ext:  2 plus pulses, no edema, no cyanosis,  no clubbing Skin:  No rashes no nodules Neuro:  CN II through XII intact, motor grossly intact  DEVICE  Normal device function.  See PaceArt for details.   Assess/Plan:

## 2011-09-08 NOTE — Assessment & Plan Note (Signed)
He denies anginal symptoms. Continue current meds. 

## 2011-09-08 NOTE — Assessment & Plan Note (Signed)
His device is working normally. Will recheck in several months. 

## 2012-06-25 ENCOUNTER — Telehealth: Payer: Self-pay | Admitting: *Deleted

## 2012-06-25 NOTE — Telephone Encounter (Signed)
[facility] is now following the patient.  He was released from our [employer].

## 2012-08-08 IMAGING — CR DG CHEST 2V
2 series · 2 of 2 positions shown · non-contrast
Comparison: PA and lateral chest 02/12/2011.

CLINICAL DATA: Status post pacemaker placement.

CHEST - 2 VIEW

[w chest pa]
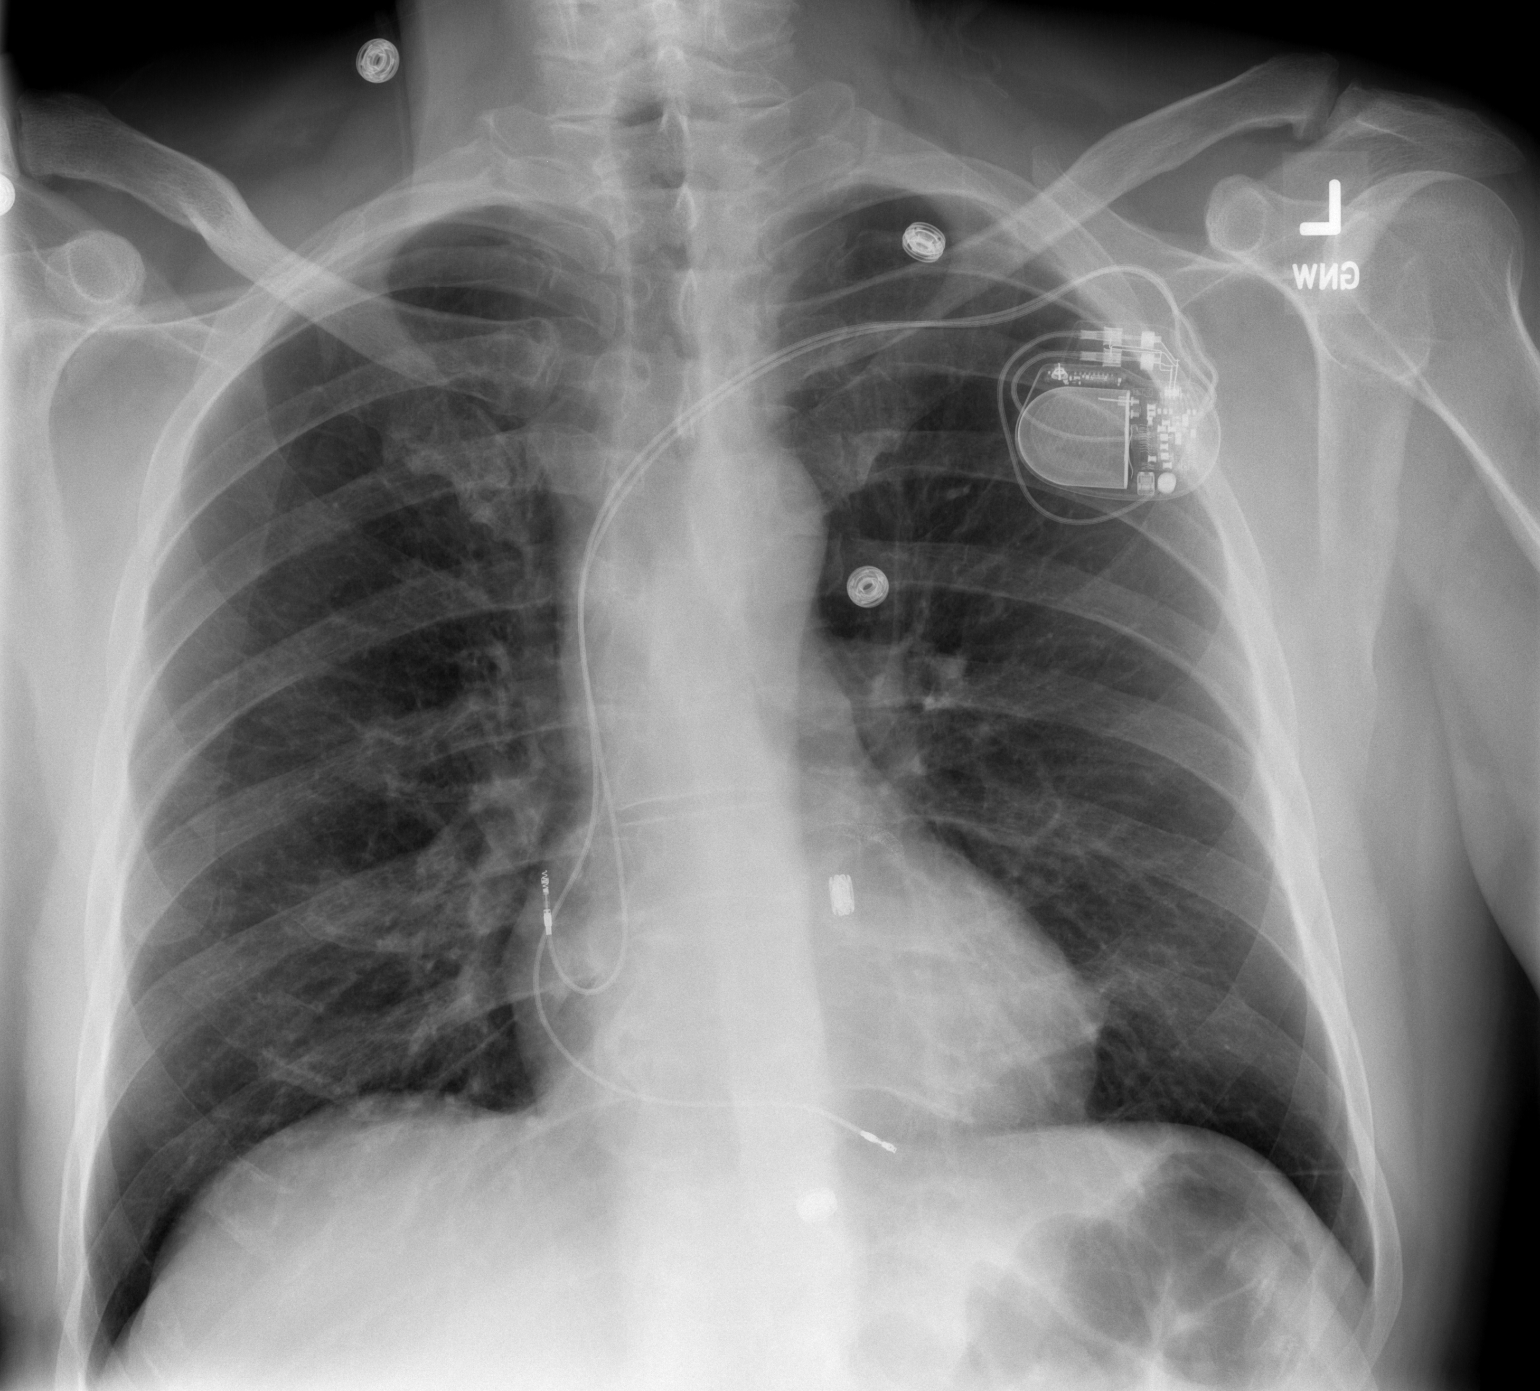

[w chest lat]
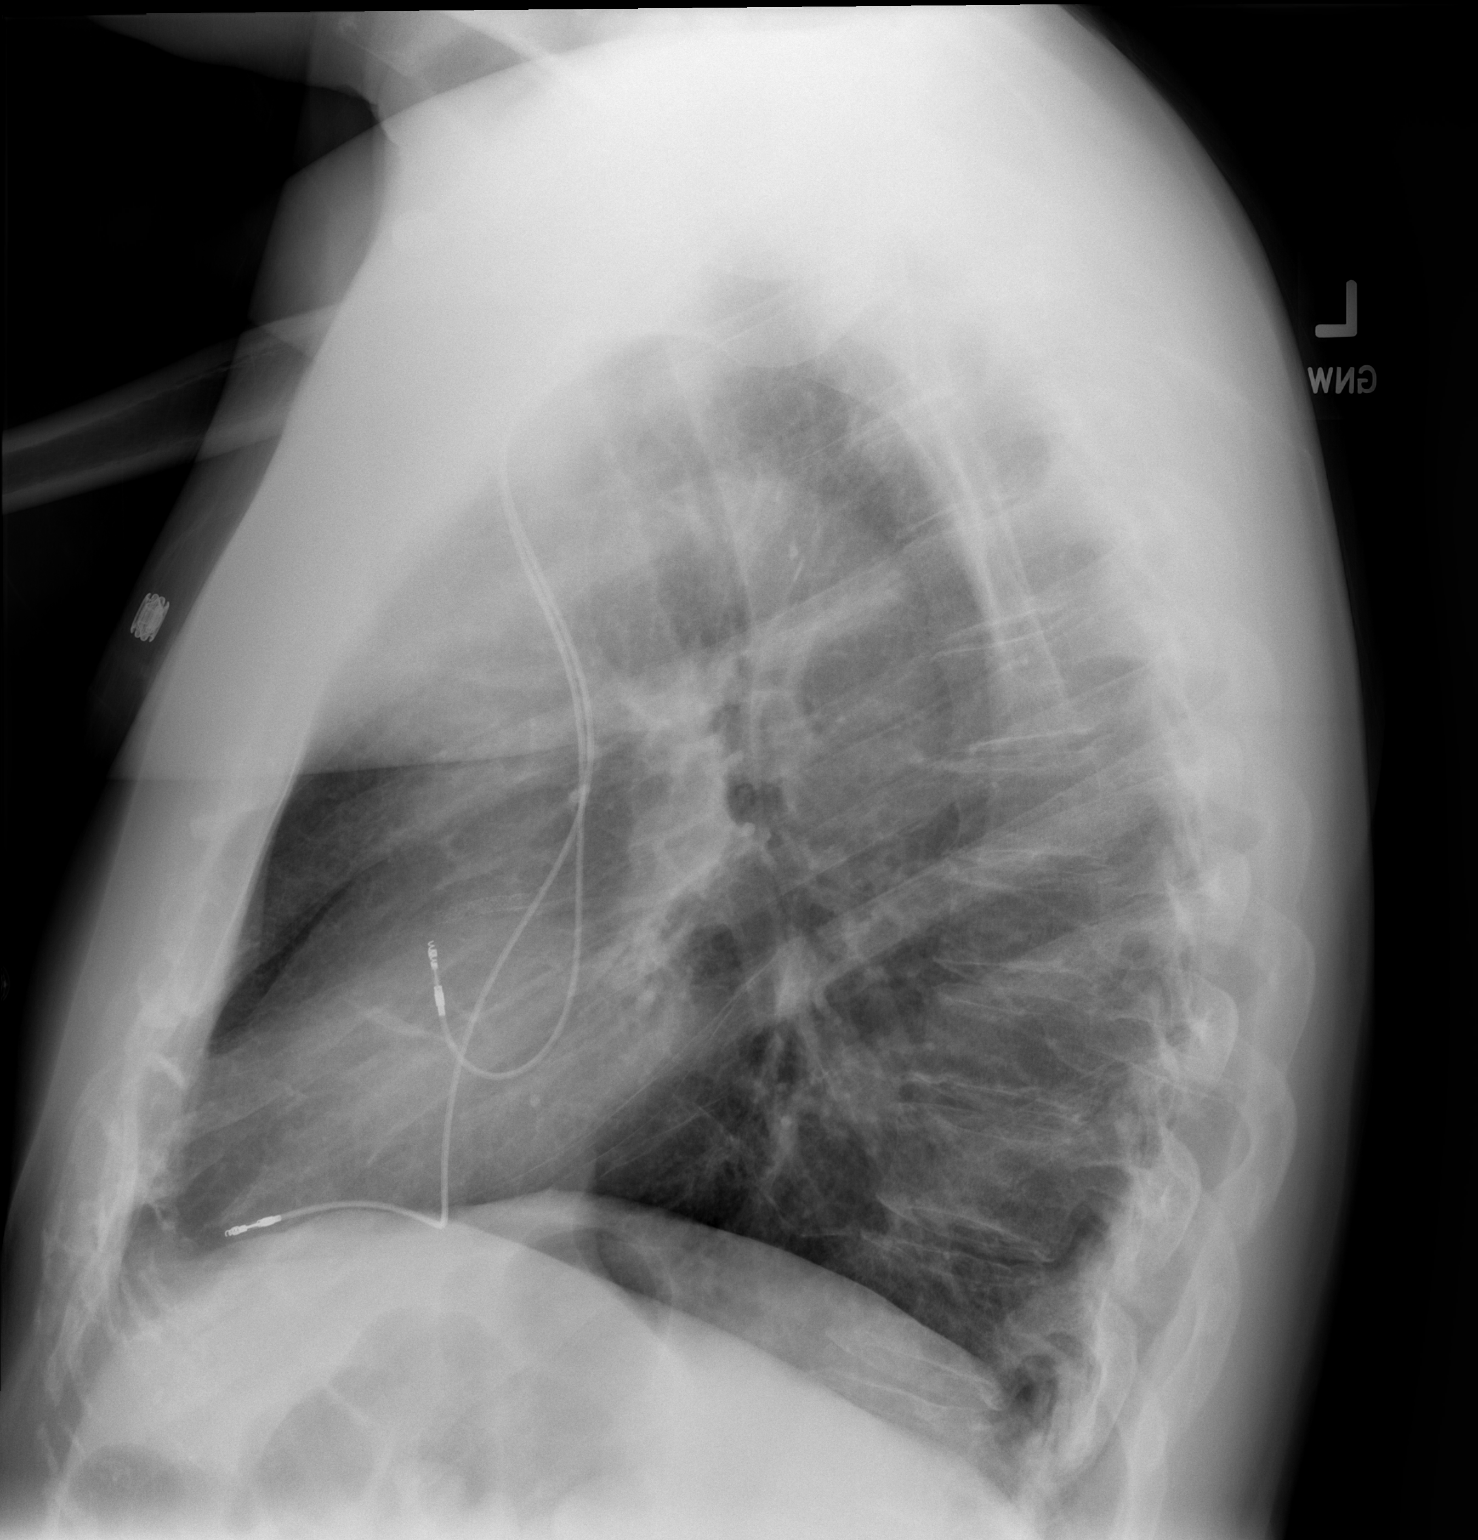

[2 of 2 positions shown; findings below may reference images not displayed]

FINDINGS: The patient has a new right subclavian approach pacing
device.  Tip of the proximal lead is in the right atrium with the
distal lead in the apex of the right ventricle.  There is no
pneumothorax.  Heart size is normal.  Lungs are clear.
IMPRESSION: New pacemaker in place without evidence of complication.  No acute
finding.

## 2012-09-13 ENCOUNTER — Other Ambulatory Visit: Payer: Self-pay | Admitting: Cardiology

## 2014-02-17 ENCOUNTER — Encounter (HOSPITAL_COMMUNITY): Payer: Self-pay | Admitting: Cardiovascular Disease

## 2014-03-28 DIAGNOSIS — J449 Chronic obstructive pulmonary disease, unspecified: Secondary | ICD-10-CM | POA: Diagnosis not present

## 2014-04-06 DIAGNOSIS — M545 Low back pain: Secondary | ICD-10-CM | POA: Diagnosis not present

## 2014-04-06 DIAGNOSIS — M25539 Pain in unspecified wrist: Secondary | ICD-10-CM | POA: Diagnosis not present

## 2014-04-06 DIAGNOSIS — M179 Osteoarthritis of knee, unspecified: Secondary | ICD-10-CM | POA: Diagnosis not present

## 2014-04-06 DIAGNOSIS — M175 Other unilateral secondary osteoarthritis of knee: Secondary | ICD-10-CM | POA: Diagnosis not present

## 2014-04-08 DIAGNOSIS — Z6826 Body mass index (BMI) 26.0-26.9, adult: Secondary | ICD-10-CM | POA: Diagnosis not present

## 2014-04-08 DIAGNOSIS — J441 Chronic obstructive pulmonary disease with (acute) exacerbation: Secondary | ICD-10-CM | POA: Diagnosis not present

## 2014-05-06 DIAGNOSIS — Z6827 Body mass index (BMI) 27.0-27.9, adult: Secondary | ICD-10-CM | POA: Diagnosis not present

## 2014-05-06 DIAGNOSIS — M5137 Other intervertebral disc degeneration, lumbosacral region: Secondary | ICD-10-CM | POA: Diagnosis not present

## 2014-05-06 DIAGNOSIS — G8929 Other chronic pain: Secondary | ICD-10-CM | POA: Diagnosis not present

## 2014-05-12 DIAGNOSIS — J449 Chronic obstructive pulmonary disease, unspecified: Secondary | ICD-10-CM | POA: Diagnosis not present

## 2014-06-01 DIAGNOSIS — G8929 Other chronic pain: Secondary | ICD-10-CM | POA: Diagnosis not present

## 2014-06-01 DIAGNOSIS — M5137 Other intervertebral disc degeneration, lumbosacral region: Secondary | ICD-10-CM | POA: Diagnosis not present

## 2014-06-01 DIAGNOSIS — E785 Hyperlipidemia, unspecified: Secondary | ICD-10-CM | POA: Diagnosis not present

## 2014-06-01 DIAGNOSIS — F319 Bipolar disorder, unspecified: Secondary | ICD-10-CM | POA: Diagnosis not present

## 2014-06-01 DIAGNOSIS — I10 Essential (primary) hypertension: Secondary | ICD-10-CM | POA: Diagnosis not present

## 2014-06-29 DIAGNOSIS — I498 Other specified cardiac arrhythmias: Secondary | ICD-10-CM | POA: Diagnosis not present

## 2014-06-30 DIAGNOSIS — G8929 Other chronic pain: Secondary | ICD-10-CM | POA: Diagnosis not present

## 2014-06-30 DIAGNOSIS — Z6827 Body mass index (BMI) 27.0-27.9, adult: Secondary | ICD-10-CM | POA: Diagnosis not present

## 2014-06-30 DIAGNOSIS — M5137 Other intervertebral disc degeneration, lumbosacral region: Secondary | ICD-10-CM | POA: Diagnosis not present

## 2014-06-30 DIAGNOSIS — F319 Bipolar disorder, unspecified: Secondary | ICD-10-CM | POA: Diagnosis not present

## 2014-07-06 DIAGNOSIS — J449 Chronic obstructive pulmonary disease, unspecified: Secondary | ICD-10-CM | POA: Diagnosis not present

## 2014-07-22 DIAGNOSIS — Z6826 Body mass index (BMI) 26.0-26.9, adult: Secondary | ICD-10-CM | POA: Diagnosis not present

## 2014-07-22 DIAGNOSIS — M272 Inflammatory conditions of jaws: Secondary | ICD-10-CM | POA: Diagnosis not present

## 2014-07-28 DIAGNOSIS — G8929 Other chronic pain: Secondary | ICD-10-CM | POA: Diagnosis not present

## 2014-07-28 DIAGNOSIS — Z6826 Body mass index (BMI) 26.0-26.9, adult: Secondary | ICD-10-CM | POA: Diagnosis not present

## 2014-07-28 DIAGNOSIS — I10 Essential (primary) hypertension: Secondary | ICD-10-CM | POA: Diagnosis not present

## 2014-07-28 DIAGNOSIS — M5137 Other intervertebral disc degeneration, lumbosacral region: Secondary | ICD-10-CM | POA: Diagnosis not present

## 2014-08-02 DIAGNOSIS — J449 Chronic obstructive pulmonary disease, unspecified: Secondary | ICD-10-CM | POA: Diagnosis not present

## 2014-08-25 DIAGNOSIS — Z6826 Body mass index (BMI) 26.0-26.9, adult: Secondary | ICD-10-CM | POA: Diagnosis not present

## 2014-08-25 DIAGNOSIS — G8929 Other chronic pain: Secondary | ICD-10-CM | POA: Diagnosis not present

## 2014-08-25 DIAGNOSIS — M5137 Other intervertebral disc degeneration, lumbosacral region: Secondary | ICD-10-CM | POA: Diagnosis not present

## 2014-08-25 DIAGNOSIS — D692 Other nonthrombocytopenic purpura: Secondary | ICD-10-CM | POA: Diagnosis not present

## 2014-08-25 DIAGNOSIS — F319 Bipolar disorder, unspecified: Secondary | ICD-10-CM | POA: Diagnosis not present

## 2014-08-29 ENCOUNTER — Telehealth: Payer: Self-pay | Admitting: Internal Medicine

## 2014-08-29 NOTE — Telephone Encounter (Signed)
New Message  Rn from [facility] calling to see if pt can be sched for an MRI. Please call back and discuss.

## 2014-08-30 NOTE — Telephone Encounter (Signed)
Follow up       Pt has a pacemaker model [id number].  Can he have a MRI of the lumbar spine?

## 2014-08-30 NOTE — Telephone Encounter (Signed)
Spoke with Talbert Forest, RN at [facility]- patient not seen here since June 2013- transferred to [facility]. [employer] tech services # given.

## 2014-09-07 DIAGNOSIS — Z45018 Encounter for adjustment and management of other part of cardiac pacemaker: Secondary | ICD-10-CM

## 2014-09-07 DIAGNOSIS — R001 Bradycardia, unspecified: Secondary | ICD-10-CM | POA: Diagnosis not present

## 2014-09-07 DIAGNOSIS — J449 Chronic obstructive pulmonary disease, unspecified: Secondary | ICD-10-CM | POA: Diagnosis not present

## 2014-09-07 HISTORY — DX: Encounter for adjustment and management of other part of cardiac pacemaker: Z45.018

## 2014-09-22 DIAGNOSIS — F319 Bipolar disorder, unspecified: Secondary | ICD-10-CM | POA: Diagnosis not present

## 2014-09-22 DIAGNOSIS — M5137 Other intervertebral disc degeneration, lumbosacral region: Secondary | ICD-10-CM | POA: Diagnosis not present

## 2014-09-22 DIAGNOSIS — Z6827 Body mass index (BMI) 27.0-27.9, adult: Secondary | ICD-10-CM | POA: Diagnosis not present

## 2014-09-22 DIAGNOSIS — G8929 Other chronic pain: Secondary | ICD-10-CM | POA: Diagnosis not present

## 2014-10-05 DIAGNOSIS — J449 Chronic obstructive pulmonary disease, unspecified: Secondary | ICD-10-CM | POA: Diagnosis not present

## 2014-10-13 DIAGNOSIS — I10 Essential (primary) hypertension: Secondary | ICD-10-CM | POA: Diagnosis not present

## 2014-10-13 DIAGNOSIS — R001 Bradycardia, unspecified: Secondary | ICD-10-CM | POA: Diagnosis not present

## 2014-10-13 DIAGNOSIS — I2510 Atherosclerotic heart disease of native coronary artery without angina pectoris: Secondary | ICD-10-CM | POA: Diagnosis not present

## 2014-10-13 DIAGNOSIS — Z950 Presence of cardiac pacemaker: Secondary | ICD-10-CM | POA: Diagnosis not present

## 2014-10-28 DIAGNOSIS — J449 Chronic obstructive pulmonary disease, unspecified: Secondary | ICD-10-CM | POA: Diagnosis not present

## 2014-11-01 DIAGNOSIS — I2510 Atherosclerotic heart disease of native coronary artery without angina pectoris: Secondary | ICD-10-CM | POA: Diagnosis not present

## 2014-11-10 DIAGNOSIS — I2510 Atherosclerotic heart disease of native coronary artery without angina pectoris: Secondary | ICD-10-CM | POA: Diagnosis not present

## 2014-11-17 DIAGNOSIS — G8929 Other chronic pain: Secondary | ICD-10-CM | POA: Diagnosis not present

## 2014-11-17 DIAGNOSIS — Z6826 Body mass index (BMI) 26.0-26.9, adult: Secondary | ICD-10-CM | POA: Diagnosis not present

## 2014-11-17 DIAGNOSIS — M5137 Other intervertebral disc degeneration, lumbosacral region: Secondary | ICD-10-CM | POA: Diagnosis not present

## 2014-11-17 DIAGNOSIS — D692 Other nonthrombocytopenic purpura: Secondary | ICD-10-CM | POA: Diagnosis not present

## 2014-12-01 DIAGNOSIS — J449 Chronic obstructive pulmonary disease, unspecified: Secondary | ICD-10-CM | POA: Diagnosis not present

## 2014-12-08 DIAGNOSIS — J342 Deviated nasal septum: Secondary | ICD-10-CM | POA: Diagnosis not present

## 2014-12-08 DIAGNOSIS — Z77122 Contact with and (suspected) exposure to noise: Secondary | ICD-10-CM | POA: Diagnosis not present

## 2014-12-08 DIAGNOSIS — H9190 Unspecified hearing loss, unspecified ear: Secondary | ICD-10-CM | POA: Diagnosis not present

## 2014-12-08 DIAGNOSIS — H6121 Impacted cerumen, right ear: Secondary | ICD-10-CM | POA: Diagnosis not present

## 2014-12-14 DIAGNOSIS — I10 Essential (primary) hypertension: Secondary | ICD-10-CM | POA: Diagnosis not present

## 2014-12-14 DIAGNOSIS — E785 Hyperlipidemia, unspecified: Secondary | ICD-10-CM | POA: Diagnosis not present

## 2014-12-14 DIAGNOSIS — D692 Other nonthrombocytopenic purpura: Secondary | ICD-10-CM | POA: Diagnosis not present

## 2014-12-14 DIAGNOSIS — M5137 Other intervertebral disc degeneration, lumbosacral region: Secondary | ICD-10-CM | POA: Diagnosis not present

## 2014-12-14 DIAGNOSIS — Z23 Encounter for immunization: Secondary | ICD-10-CM | POA: Diagnosis not present

## 2015-01-02 DIAGNOSIS — H6121 Impacted cerumen, right ear: Secondary | ICD-10-CM | POA: Diagnosis not present

## 2015-01-04 DIAGNOSIS — J449 Chronic obstructive pulmonary disease, unspecified: Secondary | ICD-10-CM | POA: Diagnosis not present

## 2015-01-11 DIAGNOSIS — I10 Essential (primary) hypertension: Secondary | ICD-10-CM | POA: Diagnosis not present

## 2015-01-11 DIAGNOSIS — F319 Bipolar disorder, unspecified: Secondary | ICD-10-CM | POA: Diagnosis not present

## 2015-01-11 DIAGNOSIS — M5137 Other intervertebral disc degeneration, lumbosacral region: Secondary | ICD-10-CM | POA: Diagnosis not present

## 2015-01-11 DIAGNOSIS — E785 Hyperlipidemia, unspecified: Secondary | ICD-10-CM | POA: Diagnosis not present

## 2015-01-11 DIAGNOSIS — G8929 Other chronic pain: Secondary | ICD-10-CM | POA: Diagnosis not present

## 2015-02-08 DIAGNOSIS — Z6827 Body mass index (BMI) 27.0-27.9, adult: Secondary | ICD-10-CM | POA: Diagnosis not present

## 2015-02-08 DIAGNOSIS — J449 Chronic obstructive pulmonary disease, unspecified: Secondary | ICD-10-CM | POA: Diagnosis not present

## 2015-02-08 DIAGNOSIS — M5137 Other intervertebral disc degeneration, lumbosacral region: Secondary | ICD-10-CM | POA: Diagnosis not present

## 2015-02-08 DIAGNOSIS — G8929 Other chronic pain: Secondary | ICD-10-CM | POA: Diagnosis not present

## 2015-02-13 DIAGNOSIS — J449 Chronic obstructive pulmonary disease, unspecified: Secondary | ICD-10-CM | POA: Diagnosis not present

## 2015-03-08 DIAGNOSIS — J019 Acute sinusitis, unspecified: Secondary | ICD-10-CM | POA: Diagnosis not present

## 2015-03-08 DIAGNOSIS — M5137 Other intervertebral disc degeneration, lumbosacral region: Secondary | ICD-10-CM | POA: Diagnosis not present

## 2015-03-08 DIAGNOSIS — Z6827 Body mass index (BMI) 27.0-27.9, adult: Secondary | ICD-10-CM | POA: Diagnosis not present

## 2015-03-08 DIAGNOSIS — G8929 Other chronic pain: Secondary | ICD-10-CM | POA: Diagnosis not present

## 2015-03-10 DIAGNOSIS — J449 Chronic obstructive pulmonary disease, unspecified: Secondary | ICD-10-CM | POA: Diagnosis not present

## 2015-03-21 DIAGNOSIS — R0789 Other chest pain: Secondary | ICD-10-CM | POA: Diagnosis not present

## 2015-03-21 DIAGNOSIS — M545 Low back pain: Secondary | ICD-10-CM | POA: Diagnosis not present

## 2015-04-04 DIAGNOSIS — J449 Chronic obstructive pulmonary disease, unspecified: Secondary | ICD-10-CM | POA: Diagnosis not present

## 2015-04-05 DIAGNOSIS — R0789 Other chest pain: Secondary | ICD-10-CM | POA: Diagnosis not present

## 2015-04-05 DIAGNOSIS — M5137 Other intervertebral disc degeneration, lumbosacral region: Secondary | ICD-10-CM | POA: Diagnosis not present

## 2015-04-05 DIAGNOSIS — G8929 Other chronic pain: Secondary | ICD-10-CM | POA: Diagnosis not present

## 2015-05-01 DIAGNOSIS — J449 Chronic obstructive pulmonary disease, unspecified: Secondary | ICD-10-CM | POA: Diagnosis not present

## 2015-05-03 DIAGNOSIS — I10 Essential (primary) hypertension: Secondary | ICD-10-CM | POA: Diagnosis not present

## 2015-05-03 DIAGNOSIS — E785 Hyperlipidemia, unspecified: Secondary | ICD-10-CM | POA: Diagnosis not present

## 2015-05-03 DIAGNOSIS — D692 Other nonthrombocytopenic purpura: Secondary | ICD-10-CM | POA: Diagnosis not present

## 2015-05-25 DIAGNOSIS — J449 Chronic obstructive pulmonary disease, unspecified: Secondary | ICD-10-CM | POA: Diagnosis not present

## 2015-05-31 DIAGNOSIS — Z6828 Body mass index (BMI) 28.0-28.9, adult: Secondary | ICD-10-CM | POA: Diagnosis not present

## 2015-05-31 DIAGNOSIS — G8929 Other chronic pain: Secondary | ICD-10-CM | POA: Diagnosis not present

## 2015-05-31 DIAGNOSIS — M5137 Other intervertebral disc degeneration, lumbosacral region: Secondary | ICD-10-CM | POA: Diagnosis not present

## 2015-05-31 DIAGNOSIS — F319 Bipolar disorder, unspecified: Secondary | ICD-10-CM | POA: Diagnosis not present

## 2015-06-19 DIAGNOSIS — J449 Chronic obstructive pulmonary disease, unspecified: Secondary | ICD-10-CM | POA: Diagnosis not present

## 2015-06-28 DIAGNOSIS — G8929 Other chronic pain: Secondary | ICD-10-CM | POA: Diagnosis not present

## 2015-06-28 DIAGNOSIS — M5137 Other intervertebral disc degeneration, lumbosacral region: Secondary | ICD-10-CM | POA: Diagnosis not present

## 2015-06-30 DIAGNOSIS — Z950 Presence of cardiac pacemaker: Secondary | ICD-10-CM | POA: Diagnosis not present

## 2015-07-18 DIAGNOSIS — J449 Chronic obstructive pulmonary disease, unspecified: Secondary | ICD-10-CM | POA: Diagnosis not present

## 2015-07-21 DIAGNOSIS — I10 Essential (primary) hypertension: Secondary | ICD-10-CM | POA: Diagnosis not present

## 2015-07-21 DIAGNOSIS — G804 Ataxic cerebral palsy: Secondary | ICD-10-CM | POA: Diagnosis not present

## 2015-07-21 DIAGNOSIS — G809 Cerebral palsy, unspecified: Secondary | ICD-10-CM | POA: Diagnosis not present

## 2015-07-21 DIAGNOSIS — D692 Other nonthrombocytopenic purpura: Secondary | ICD-10-CM | POA: Diagnosis not present

## 2015-07-21 DIAGNOSIS — E785 Hyperlipidemia, unspecified: Secondary | ICD-10-CM | POA: Diagnosis not present

## 2015-07-28 DIAGNOSIS — E875 Hyperkalemia: Secondary | ICD-10-CM | POA: Diagnosis not present

## 2015-08-21 DIAGNOSIS — M5137 Other intervertebral disc degeneration, lumbosacral region: Secondary | ICD-10-CM | POA: Diagnosis not present

## 2015-08-21 DIAGNOSIS — Z6826 Body mass index (BMI) 26.0-26.9, adult: Secondary | ICD-10-CM | POA: Diagnosis not present

## 2015-08-21 DIAGNOSIS — F319 Bipolar disorder, unspecified: Secondary | ICD-10-CM | POA: Diagnosis not present

## 2015-08-21 DIAGNOSIS — G609 Hereditary and idiopathic neuropathy, unspecified: Secondary | ICD-10-CM | POA: Diagnosis not present

## 2015-08-21 DIAGNOSIS — G8929 Other chronic pain: Secondary | ICD-10-CM | POA: Diagnosis not present

## 2015-08-24 DIAGNOSIS — J449 Chronic obstructive pulmonary disease, unspecified: Secondary | ICD-10-CM | POA: Diagnosis not present

## 2015-09-18 DIAGNOSIS — G8929 Other chronic pain: Secondary | ICD-10-CM | POA: Diagnosis not present

## 2015-09-18 DIAGNOSIS — M5137 Other intervertebral disc degeneration, lumbosacral region: Secondary | ICD-10-CM | POA: Diagnosis not present

## 2015-09-18 DIAGNOSIS — J449 Chronic obstructive pulmonary disease, unspecified: Secondary | ICD-10-CM | POA: Diagnosis not present

## 2015-10-16 DIAGNOSIS — M5137 Other intervertebral disc degeneration, lumbosacral region: Secondary | ICD-10-CM | POA: Diagnosis not present

## 2015-10-16 DIAGNOSIS — G8929 Other chronic pain: Secondary | ICD-10-CM | POA: Diagnosis not present

## 2015-10-16 DIAGNOSIS — I10 Essential (primary) hypertension: Secondary | ICD-10-CM | POA: Diagnosis not present

## 2015-10-18 DIAGNOSIS — J449 Chronic obstructive pulmonary disease, unspecified: Secondary | ICD-10-CM | POA: Diagnosis not present

## 2015-11-10 DIAGNOSIS — J449 Chronic obstructive pulmonary disease, unspecified: Secondary | ICD-10-CM | POA: Diagnosis not present

## 2015-11-14 DIAGNOSIS — G8929 Other chronic pain: Secondary | ICD-10-CM | POA: Diagnosis not present

## 2015-11-14 DIAGNOSIS — M5137 Other intervertebral disc degeneration, lumbosacral region: Secondary | ICD-10-CM | POA: Diagnosis not present

## 2015-11-23 DIAGNOSIS — I495 Sick sinus syndrome: Secondary | ICD-10-CM

## 2015-11-23 DIAGNOSIS — Z45018 Encounter for adjustment and management of other part of cardiac pacemaker: Secondary | ICD-10-CM | POA: Diagnosis not present

## 2015-11-23 HISTORY — DX: Sick sinus syndrome: I49.5

## 2015-12-12 DIAGNOSIS — M5137 Other intervertebral disc degeneration, lumbosacral region: Secondary | ICD-10-CM | POA: Diagnosis not present

## 2015-12-12 DIAGNOSIS — I10 Essential (primary) hypertension: Secondary | ICD-10-CM | POA: Diagnosis not present

## 2015-12-12 DIAGNOSIS — E785 Hyperlipidemia, unspecified: Secondary | ICD-10-CM | POA: Diagnosis not present

## 2015-12-13 DIAGNOSIS — J449 Chronic obstructive pulmonary disease, unspecified: Secondary | ICD-10-CM | POA: Diagnosis not present

## 2015-12-15 DIAGNOSIS — J0110 Acute frontal sinusitis, unspecified: Secondary | ICD-10-CM | POA: Diagnosis not present

## 2016-01-05 DIAGNOSIS — J449 Chronic obstructive pulmonary disease, unspecified: Secondary | ICD-10-CM | POA: Diagnosis not present

## 2016-01-09 DIAGNOSIS — I739 Peripheral vascular disease, unspecified: Secondary | ICD-10-CM | POA: Diagnosis not present

## 2016-01-09 DIAGNOSIS — E785 Hyperlipidemia, unspecified: Secondary | ICD-10-CM | POA: Diagnosis not present

## 2016-01-09 DIAGNOSIS — D692 Other nonthrombocytopenic purpura: Secondary | ICD-10-CM | POA: Diagnosis not present

## 2016-01-09 DIAGNOSIS — I10 Essential (primary) hypertension: Secondary | ICD-10-CM | POA: Diagnosis not present

## 2016-01-30 DIAGNOSIS — J449 Chronic obstructive pulmonary disease, unspecified: Secondary | ICD-10-CM | POA: Diagnosis not present

## 2016-02-06 DIAGNOSIS — G8929 Other chronic pain: Secondary | ICD-10-CM | POA: Diagnosis not present

## 2016-02-06 DIAGNOSIS — M5137 Other intervertebral disc degeneration, lumbosacral region: Secondary | ICD-10-CM | POA: Diagnosis not present

## 2016-07-10 DIAGNOSIS — L918 Other hypertrophic disorders of the skin: Secondary | ICD-10-CM | POA: Diagnosis not present

## 2016-07-23 DIAGNOSIS — M545 Low back pain: Secondary | ICD-10-CM | POA: Diagnosis not present

## 2016-07-23 DIAGNOSIS — R55 Syncope and collapse: Secondary | ICD-10-CM | POA: Diagnosis not present

## 2016-08-02 DIAGNOSIS — J449 Chronic obstructive pulmonary disease, unspecified: Secondary | ICD-10-CM | POA: Diagnosis not present

## 2016-08-06 DIAGNOSIS — S299XXA Unspecified injury of thorax, initial encounter: Secondary | ICD-10-CM | POA: Diagnosis not present

## 2016-08-06 DIAGNOSIS — R918 Other nonspecific abnormal finding of lung field: Secondary | ICD-10-CM | POA: Diagnosis not present

## 2016-08-06 DIAGNOSIS — R079 Chest pain, unspecified: Secondary | ICD-10-CM | POA: Diagnosis not present

## 2016-08-06 DIAGNOSIS — R0781 Pleurodynia: Secondary | ICD-10-CM | POA: Diagnosis not present

## 2016-08-06 DIAGNOSIS — Z950 Presence of cardiac pacemaker: Secondary | ICD-10-CM | POA: Diagnosis not present

## 2016-08-06 DIAGNOSIS — S2232XA Fracture of one rib, left side, initial encounter for closed fracture: Secondary | ICD-10-CM | POA: Diagnosis not present

## 2016-08-07 DIAGNOSIS — R55 Syncope and collapse: Secondary | ICD-10-CM | POA: Diagnosis not present

## 2016-08-07 DIAGNOSIS — I6523 Occlusion and stenosis of bilateral carotid arteries: Secondary | ICD-10-CM | POA: Diagnosis not present

## 2016-08-13 DIAGNOSIS — S2232XD Fracture of one rib, left side, subsequent encounter for fracture with routine healing: Secondary | ICD-10-CM | POA: Diagnosis not present

## 2016-08-20 DIAGNOSIS — G450 Vertebro-basilar artery syndrome: Secondary | ICD-10-CM | POA: Diagnosis not present

## 2016-08-20 DIAGNOSIS — M545 Low back pain: Secondary | ICD-10-CM | POA: Diagnosis not present

## 2016-08-28 DIAGNOSIS — J449 Chronic obstructive pulmonary disease, unspecified: Secondary | ICD-10-CM | POA: Diagnosis not present

## 2016-09-12 DIAGNOSIS — R9401 Abnormal electroencephalogram [EEG]: Secondary | ICD-10-CM | POA: Diagnosis not present

## 2016-09-12 DIAGNOSIS — G450 Vertebro-basilar artery syndrome: Secondary | ICD-10-CM | POA: Diagnosis not present

## 2016-09-12 DIAGNOSIS — R569 Unspecified convulsions: Secondary | ICD-10-CM | POA: Diagnosis not present

## 2016-09-17 DIAGNOSIS — S330XXD Traumatic rupture of lumbar intervertebral disc, subsequent encounter: Secondary | ICD-10-CM | POA: Diagnosis not present

## 2016-09-26 DIAGNOSIS — J449 Chronic obstructive pulmonary disease, unspecified: Secondary | ICD-10-CM | POA: Diagnosis not present

## 2016-10-15 DIAGNOSIS — E782 Mixed hyperlipidemia: Secondary | ICD-10-CM | POA: Diagnosis not present

## 2016-10-15 DIAGNOSIS — J449 Chronic obstructive pulmonary disease, unspecified: Secondary | ICD-10-CM | POA: Diagnosis not present

## 2016-10-15 DIAGNOSIS — Z79891 Long term (current) use of opiate analgesic: Secondary | ICD-10-CM | POA: Diagnosis not present

## 2016-10-15 DIAGNOSIS — K219 Gastro-esophageal reflux disease without esophagitis: Secondary | ICD-10-CM | POA: Diagnosis not present

## 2016-10-15 DIAGNOSIS — I10 Essential (primary) hypertension: Secondary | ICD-10-CM | POA: Diagnosis not present

## 2016-10-24 DIAGNOSIS — J449 Chronic obstructive pulmonary disease, unspecified: Secondary | ICD-10-CM | POA: Diagnosis not present

## 2016-10-24 DIAGNOSIS — Z950 Presence of cardiac pacemaker: Secondary | ICD-10-CM | POA: Diagnosis not present

## 2016-11-20 DIAGNOSIS — J449 Chronic obstructive pulmonary disease, unspecified: Secondary | ICD-10-CM | POA: Diagnosis not present

## 2016-12-03 DIAGNOSIS — K219 Gastro-esophageal reflux disease without esophagitis: Secondary | ICD-10-CM | POA: Diagnosis not present

## 2016-12-03 DIAGNOSIS — G894 Chronic pain syndrome: Secondary | ICD-10-CM | POA: Diagnosis not present

## 2016-12-03 DIAGNOSIS — I10 Essential (primary) hypertension: Secondary | ICD-10-CM | POA: Diagnosis not present

## 2016-12-05 DIAGNOSIS — Z45018 Encounter for adjustment and management of other part of cardiac pacemaker: Secondary | ICD-10-CM | POA: Diagnosis not present

## 2016-12-05 DIAGNOSIS — I2510 Atherosclerotic heart disease of native coronary artery without angina pectoris: Secondary | ICD-10-CM | POA: Diagnosis not present

## 2016-12-05 DIAGNOSIS — I10 Essential (primary) hypertension: Secondary | ICD-10-CM | POA: Diagnosis not present

## 2016-12-05 DIAGNOSIS — I495 Sick sinus syndrome: Secondary | ICD-10-CM | POA: Diagnosis not present

## 2016-12-05 DIAGNOSIS — I25118 Atherosclerotic heart disease of native coronary artery with other forms of angina pectoris: Secondary | ICD-10-CM | POA: Diagnosis not present

## 2017-02-04 DIAGNOSIS — J449 Chronic obstructive pulmonary disease, unspecified: Secondary | ICD-10-CM | POA: Diagnosis not present

## 2017-02-06 DIAGNOSIS — I10 Essential (primary) hypertension: Secondary | ICD-10-CM | POA: Diagnosis not present

## 2017-02-06 DIAGNOSIS — J449 Chronic obstructive pulmonary disease, unspecified: Secondary | ICD-10-CM | POA: Diagnosis not present

## 2017-02-06 DIAGNOSIS — R079 Chest pain, unspecified: Secondary | ICD-10-CM | POA: Diagnosis not present

## 2017-02-06 DIAGNOSIS — I495 Sick sinus syndrome: Secondary | ICD-10-CM | POA: Diagnosis not present

## 2017-02-06 DIAGNOSIS — I25118 Atherosclerotic heart disease of native coronary artery with other forms of angina pectoris: Secondary | ICD-10-CM | POA: Diagnosis not present

## 2017-02-07 DIAGNOSIS — R079 Chest pain, unspecified: Secondary | ICD-10-CM | POA: Diagnosis not present

## 2017-02-10 DIAGNOSIS — K219 Gastro-esophageal reflux disease without esophagitis: Secondary | ICD-10-CM | POA: Diagnosis not present

## 2017-02-10 DIAGNOSIS — D518 Other vitamin B12 deficiency anemias: Secondary | ICD-10-CM | POA: Diagnosis not present

## 2017-02-10 DIAGNOSIS — Z23 Encounter for immunization: Secondary | ICD-10-CM | POA: Diagnosis not present

## 2017-02-10 DIAGNOSIS — E782 Mixed hyperlipidemia: Secondary | ICD-10-CM | POA: Diagnosis not present

## 2017-02-10 DIAGNOSIS — R0989 Other specified symptoms and signs involving the circulatory and respiratory systems: Secondary | ICD-10-CM | POA: Diagnosis not present

## 2017-02-10 DIAGNOSIS — Z1211 Encounter for screening for malignant neoplasm of colon: Secondary | ICD-10-CM | POA: Diagnosis not present

## 2017-02-10 DIAGNOSIS — Z5181 Encounter for therapeutic drug level monitoring: Secondary | ICD-10-CM | POA: Diagnosis not present

## 2017-02-10 DIAGNOSIS — Z79899 Other long term (current) drug therapy: Secondary | ICD-10-CM | POA: Diagnosis not present

## 2017-02-10 DIAGNOSIS — E559 Vitamin D deficiency, unspecified: Secondary | ICD-10-CM | POA: Diagnosis not present

## 2017-02-10 DIAGNOSIS — E038 Other specified hypothyroidism: Secondary | ICD-10-CM | POA: Diagnosis not present

## 2017-02-10 DIAGNOSIS — Z0000 Encounter for general adult medical examination without abnormal findings: Secondary | ICD-10-CM | POA: Diagnosis not present

## 2017-02-10 DIAGNOSIS — I10 Essential (primary) hypertension: Secondary | ICD-10-CM | POA: Diagnosis not present

## 2017-02-10 DIAGNOSIS — N400 Enlarged prostate without lower urinary tract symptoms: Secondary | ICD-10-CM | POA: Diagnosis not present

## 2017-02-12 DIAGNOSIS — I714 Abdominal aortic aneurysm, without rupture: Secondary | ICD-10-CM | POA: Diagnosis not present

## 2017-02-12 DIAGNOSIS — R221 Localized swelling, mass and lump, neck: Secondary | ICD-10-CM | POA: Diagnosis not present

## 2017-02-19 DIAGNOSIS — N400 Enlarged prostate without lower urinary tract symptoms: Secondary | ICD-10-CM | POA: Diagnosis not present

## 2017-02-19 DIAGNOSIS — E782 Mixed hyperlipidemia: Secondary | ICD-10-CM | POA: Diagnosis not present

## 2017-02-19 DIAGNOSIS — I10 Essential (primary) hypertension: Secondary | ICD-10-CM | POA: Diagnosis not present

## 2017-02-19 DIAGNOSIS — I2510 Atherosclerotic heart disease of native coronary artery without angina pectoris: Secondary | ICD-10-CM | POA: Diagnosis not present

## 2017-02-27 DIAGNOSIS — J449 Chronic obstructive pulmonary disease, unspecified: Secondary | ICD-10-CM | POA: Diagnosis not present

## 2017-03-17 DIAGNOSIS — M7732 Calcaneal spur, left foot: Secondary | ICD-10-CM | POA: Diagnosis not present

## 2017-03-17 DIAGNOSIS — I10 Essential (primary) hypertension: Secondary | ICD-10-CM | POA: Diagnosis not present

## 2017-03-17 DIAGNOSIS — M25572 Pain in left ankle and joints of left foot: Secondary | ICD-10-CM | POA: Diagnosis not present

## 2017-03-17 DIAGNOSIS — M47816 Spondylosis without myelopathy or radiculopathy, lumbar region: Secondary | ICD-10-CM | POA: Diagnosis not present

## 2017-03-17 DIAGNOSIS — M5136 Other intervertebral disc degeneration, lumbar region: Secondary | ICD-10-CM | POA: Diagnosis not present

## 2017-03-17 DIAGNOSIS — M79672 Pain in left foot: Secondary | ICD-10-CM | POA: Diagnosis not present

## 2017-03-17 DIAGNOSIS — E782 Mixed hyperlipidemia: Secondary | ICD-10-CM | POA: Diagnosis not present

## 2017-03-17 DIAGNOSIS — K219 Gastro-esophageal reflux disease without esophagitis: Secondary | ICD-10-CM | POA: Diagnosis not present

## 2017-03-17 DIAGNOSIS — M79662 Pain in left lower leg: Secondary | ICD-10-CM | POA: Diagnosis not present

## 2017-03-17 DIAGNOSIS — N400 Enlarged prostate without lower urinary tract symptoms: Secondary | ICD-10-CM | POA: Diagnosis not present

## 2017-03-19 DIAGNOSIS — Z5181 Encounter for therapeutic drug level monitoring: Secondary | ICD-10-CM | POA: Diagnosis not present

## 2017-03-21 DIAGNOSIS — E782 Mixed hyperlipidemia: Secondary | ICD-10-CM | POA: Diagnosis not present

## 2017-03-21 DIAGNOSIS — I10 Essential (primary) hypertension: Secondary | ICD-10-CM | POA: Diagnosis not present

## 2017-03-21 DIAGNOSIS — N400 Enlarged prostate without lower urinary tract symptoms: Secondary | ICD-10-CM | POA: Diagnosis not present

## 2017-03-21 DIAGNOSIS — I2510 Atherosclerotic heart disease of native coronary artery without angina pectoris: Secondary | ICD-10-CM | POA: Diagnosis not present

## 2017-03-25 DIAGNOSIS — J449 Chronic obstructive pulmonary disease, unspecified: Secondary | ICD-10-CM | POA: Diagnosis not present

## 2017-03-26 DIAGNOSIS — Z79899 Other long term (current) drug therapy: Secondary | ICD-10-CM | POA: Diagnosis not present

## 2017-03-28 DIAGNOSIS — E138 Other specified diabetes mellitus with unspecified complications: Secondary | ICD-10-CM | POA: Diagnosis not present

## 2017-03-31 DIAGNOSIS — M19072 Primary osteoarthritis, left ankle and foot: Secondary | ICD-10-CM | POA: Diagnosis not present

## 2017-03-31 DIAGNOSIS — M722 Plantar fascial fibromatosis: Secondary | ICD-10-CM | POA: Diagnosis not present

## 2017-03-31 DIAGNOSIS — G9009 Other idiopathic peripheral autonomic neuropathy: Secondary | ICD-10-CM | POA: Diagnosis not present

## 2017-04-06 DIAGNOSIS — S0990XA Unspecified injury of head, initial encounter: Secondary | ICD-10-CM | POA: Diagnosis not present

## 2017-04-06 DIAGNOSIS — S0181XA Laceration without foreign body of other part of head, initial encounter: Secondary | ICD-10-CM | POA: Diagnosis not present

## 2017-04-06 DIAGNOSIS — S098XXA Other specified injuries of head, initial encounter: Secondary | ICD-10-CM | POA: Diagnosis not present

## 2017-04-06 DIAGNOSIS — T148XXA Other injury of unspecified body region, initial encounter: Secondary | ICD-10-CM | POA: Diagnosis not present

## 2017-04-06 DIAGNOSIS — S199XXA Unspecified injury of neck, initial encounter: Secondary | ICD-10-CM | POA: Diagnosis not present

## 2017-04-06 DIAGNOSIS — S299XXA Unspecified injury of thorax, initial encounter: Secondary | ICD-10-CM | POA: Diagnosis not present

## 2017-04-06 DIAGNOSIS — S0091XA Abrasion of unspecified part of head, initial encounter: Secondary | ICD-10-CM | POA: Diagnosis not present

## 2017-04-08 DIAGNOSIS — M5136 Other intervertebral disc degeneration, lumbar region: Secondary | ICD-10-CM | POA: Diagnosis not present

## 2017-04-08 DIAGNOSIS — J449 Chronic obstructive pulmonary disease, unspecified: Secondary | ICD-10-CM | POA: Diagnosis not present

## 2017-04-08 DIAGNOSIS — M47816 Spondylosis without myelopathy or radiculopathy, lumbar region: Secondary | ICD-10-CM | POA: Diagnosis not present

## 2017-04-08 DIAGNOSIS — G894 Chronic pain syndrome: Secondary | ICD-10-CM | POA: Diagnosis not present

## 2017-04-08 DIAGNOSIS — K219 Gastro-esophageal reflux disease without esophagitis: Secondary | ICD-10-CM | POA: Diagnosis not present

## 2017-06-10 ENCOUNTER — Other Ambulatory Visit: Payer: Self-pay

## 2017-06-10 ENCOUNTER — Encounter: Payer: Self-pay | Admitting: Neurology

## 2017-06-10 ENCOUNTER — Ambulatory Visit (INDEPENDENT_AMBULATORY_CARE_PROVIDER_SITE_OTHER): Payer: Medicare Other | Admitting: Neurology

## 2017-06-10 ENCOUNTER — Encounter (INDEPENDENT_AMBULATORY_CARE_PROVIDER_SITE_OTHER): Payer: Self-pay

## 2017-06-10 DIAGNOSIS — M545 Low back pain, unspecified: Secondary | ICD-10-CM

## 2017-06-10 DIAGNOSIS — M5441 Lumbago with sciatica, right side: Secondary | ICD-10-CM

## 2017-06-10 DIAGNOSIS — G8929 Other chronic pain: Secondary | ICD-10-CM

## 2017-06-10 DIAGNOSIS — M5442 Lumbago with sciatica, left side: Secondary | ICD-10-CM | POA: Diagnosis not present

## 2017-06-10 HISTORY — DX: Other chronic pain: G89.29

## 2017-06-10 HISTORY — DX: Low back pain, unspecified: M54.50

## 2017-06-10 NOTE — Patient Instructions (Signed)
We will get EMG and NCV study to look at the nerve function of the legs. 

## 2017-06-10 NOTE — Progress Notes (Signed)
Reason for visit: Chronic low back pain  Referring physician: Dr. Devoria Albe is a 57 y.o. male  History of present illness:  Mr. Schrom is a 57 year old right-handed white male with a history of chronic low back pain he claims has been present for about 25 years.  In the past, he has been on methadone and he has been treated through various pain centers, he has had epidural steroid injections with minimal benefit.  The patient has been on disability since 2012 following a myocardial infarction that required a pacemaker placement as well.  The patient has ongoing chronic pain in the low back with pain radiating down the sides of the legs to the feet with some numbness and tingling sensations in the toes, left greater than right.  The patient is not sure whether there is any true weakness in the legs.  The patient has constant pain in the low back that may be slightly worse when he is up on his feet.  The patient recently had a CT scan of the lumbar spine done at [facility], the results are not available to me.  The patient denies any issues controlling the bowels or the bladder.  He denies any significant balance problems but he does stumble on occasion.  He has a sensation of restless leg syndrome at night.  He has been on some gabapentin recently which did help this but he ran out of the medication.  The patient now is on oxycodone taking 5 mg tablets 3 times daily.  He has been seen by his podiatrist, Dr. Fanny Dance, and he suggested getting EMG and nerve conduction study evaluation to look at the legs and feet.  Past Medical History:  Diagnosis Date  . Anxiety and depression   . Arthritis   . Bradycardia    chronic  . BRADYCARDIA, CHRONIC 04/23/2010  . Chronic low back pain 06/10/2017  . COPD (chronic obstructive pulmonary disease) (HCC)   . Coronary artery disease april 2010   cardiac cath  . Gastric ulcer with perforation (HCC) 2002  . Heart murmur   . Hyperlipidemia   .  Hypertension   . Sinus bradycardia   . SOB (shortness of breath)     Past Surgical History:  Procedure Laterality Date  . CARDIAC CATHETERIZATION  06/2008   At [facility]. Mild LAD stenosis  . CARDIAC CATHETERIZATION  05/2010   95% mid LAD stenosis, 20% RCA stenosis  . CORONARY ANGIOPLASTY WITH STENT PLACEMENT  05/2010   Mid LAD: DES: 3.0 X23 mm Promus  . HERNIA REPAIR    . KNEE SURGERY    . LEFT HEART CATHETERIZATION WITH CORONARY ANGIOGRAM N/A 05/15/2011   Procedure: LEFT HEART CATHETERIZATION WITH CORONARY ANGIOGRAM;  Surgeon: Iran Ouch, MD;  Location: [facility] CATH LAB;  Service: Cardiovascular;  Laterality: N/A;  . PERMANENT PACEMAKER INSERTION N/A 05/31/2011   Procedure: PERMANENT PACEMAKER INSERTION;  Surgeon: Marinus Maw, MD;  Location: [facility] CATH LAB;  Service: Cardiovascular;  Laterality: N/A;  . TONSILLECTOMY    . VARICOSE VEIN SURGERY      Family History  Problem Relation Age of Onset  . Heart failure Father 66       CHF  . Brain cancer Father   . Lung cancer Father   . Bone cancer Father   . Prostate cancer Paternal Uncle     Social history:  reports that he has been smoking cigarettes and cigars.  He has a 50.00 pack-year smoking  history. He does not have any smokeless tobacco history on file. He reports that he does not drink alcohol or use drugs.  Medications:  Prior to Admission medications   Medication Sig Start Date End Date Taking? Authorizing Provider  acetaminophen (TYLENOL) 325 MG tablet Take 650 mg by mouth every 6 (six) hours as needed. For pain   Yes [provider]  albuterol (PROVENTIL HFA;VENTOLIN HFA) 108 (90 BASE) MCG/ACT inhaler Inhale 2 puffs into the lungs every 6 (six) hours as needed. For shortness of breath   Yes [provider]  ALPRAZolam (XANAX) 0.5 MG tablet Take 0.5 mg by mouth 3 (three) times daily.     Yes [provider]  clopidogrel (PLAVIX) 75 MG tablet TAKE ONE TABLET BY MOUTH ONE TIME DAILY 09/13/12  Yes  Rollene Rotunda, MD  DIOVAN HCT 320-12.5 MG per tablet Take 1 tablet by mouth daily.  07/03/11  Yes [provider]  escitalopram (LEXAPRO) 20 MG tablet Take 20 mg by mouth daily.     Yes [provider]  fluticasone (FLONASE) 50 MCG/ACT nasal spray Place 2 sprays into the nose daily as needed. For allergies   Yes [provider]  Mometasone Furo-Formoterol Fum (DULERA) 200-5 MCG/ACT AERO Inhale 3 puffs into the lungs 2 (two) times daily.   Yes [provider]  nitroGLYCERIN (NITROSTAT) 0.4 MG SL tablet Place 0.4 mg under the tongue every 5 (five) minutes as needed. For chest pain   Yes [provider]  omeprazole (PRILOSEC) 20 MG capsule Take 20 mg by mouth daily before breakfast.    Yes [provider]  oxyCODONE-acetaminophen (PERCOCET/ROXICET) 5-325 MG tablet Take 1 tablet by mouth 3 (three) times daily.   Yes [provider]  QUEtiapine (SEROQUEL) 50 MG tablet Take 100-150 mg by mouth at bedtime. To take 2-3 tablets at bedtime as needed   Yes [provider]  simvastatin (ZOCOR) 40 MG tablet Take 40 mg by mouth every evening.   Yes [provider]  ALBUTEROL IN Inhale 2 puffs into the lungs as needed.    05/27/11  [provider]      Allergies  Allergen Reactions  . Codeine     rash  . Ibuprofen     Upset stomach  . Penicillins     rash    ROS:  Out of a complete 14 system review of symptoms, the patient complains only of the following symptoms, and all other reviewed systems are negative.  Shortness of breath Urination problems Joint pain Anxiety  Blood pressure (!) 154/98, pulse 80, height 6' (1.829 m), weight 208 lb (94.3 kg).  Physical Exam  General: The patient is alert and cooperative at the time of the examination.  Eyes: Pupils are equal, round, and reactive to light. Discs are flat bilaterally.  Neck: The neck is supple, no carotid bruits are noted.  Respiratory: The  respiratory examination is clear.  Cardiovascular: The cardiovascular examination reveals a regular rate and rhythm, no obvious murmurs or rubs are noted.   Neuromuscular: The patient is able to flex the back only to about 90 degrees.  Minimal rotational movement is noted in the low back.  Skin: Extremities are without significant edema.  Neurologic Exam  Mental status: The patient is alert and oriented x 3 at the time of the examination. The patient has apparent normal recent and remote memory, with an apparently normal attention span and concentration ability.  Cranial nerves: Facial symmetry is present. There is  good sensation of the face to pinprick and soft touch bilaterally. The strength of the facial muscles and the muscles to head turning and shoulder shrug are normal bilaterally. Speech is well enunciated, no aphasia or dysarthria is noted. Extraocular movements are full. Visual fields are full. The tongue is midline, and the patient has symmetric elevation of the soft palate. No obvious hearing deficits are noted.  Motor: The motor testing reveals 5 over 5 strength of all 4 extremities. Good symmetric motor tone is noted throughout.  Sensory: Sensory testing is intact to pinprick, soft touch, vibration sensation, and position sense on all 4 extremities. No evidence of extinction is noted.  Coordination: Cerebellar testing reveals good finger-nose-finger and heel-to-shin bilaterally.  Gait and station: Gait is normal. Tandem gait is normal. Romberg is negative. No drift is seen.  The patient is able to walk on the heels and the toes bilaterally.  Reflexes: Deep tendon reflexes are symmetric and normal bilaterally.  Ankle jerk reflexes are well-maintained bilaterally.   Assessment/Plan:  1.  Chronic low back pain  The patient likely does not have a peripheral neuropathy, he may have tingling and discomfort in the feet related to the low back issue.  The patient will be set up for  nerve conduction studies of both legs and EMG studies on both legs.  We will get the report of the CT of the lumbar spine [facility].  The patient will follow-up for the above study.  Addendum: We have received report of CT scan of the abdomen done on 30 April 2017.  Disc bulges at the L3-4 and L4-5 level were noted.  The patient did not have CT of the lumbar spine.  MRI of the lumbar spine was done in August 2010.  Mild spinal stenosis was noted at the L3-4 level with mild bilateral foraminal stenosis.  A central disc protrusion was noted at the L4-5 level and a moderate and left paracentral disc protrusion was noted at the L5-S1 level and may contact the left S1 nerve root.  Marlan Palau. Keith Willis MD 06/10/2017 11:52 AM  [facility] [address] [place], [place] [id number]  Phone [phone] Fax [phone]

## 2017-07-14 ENCOUNTER — Encounter: Payer: Self-pay | Admitting: Neurology

## 2017-07-14 ENCOUNTER — Ambulatory Visit (INDEPENDENT_AMBULATORY_CARE_PROVIDER_SITE_OTHER): Payer: Medicare Other | Admitting: Neurology

## 2017-07-14 DIAGNOSIS — G8929 Other chronic pain: Secondary | ICD-10-CM

## 2017-07-14 DIAGNOSIS — M5442 Lumbago with sciatica, left side: Secondary | ICD-10-CM | POA: Diagnosis not present

## 2017-07-14 DIAGNOSIS — M5441 Lumbago with sciatica, right side: Secondary | ICD-10-CM | POA: Diagnosis not present

## 2017-07-14 NOTE — Progress Notes (Signed)
[facility]    Nerve / Sites Muscle Latency Ref. Amplitude Ref. Rel Amp Segments Distance Velocity Ref. Area    ms ms mV mV %  cm m/s m/s mVms  R Peroneal - EDB     Ankle EDB 4.9 ?6.5 7.1 ?2.0 100 Ankle - EDB 9   21.6     Fib head EDB 11.8  6.3  88.3 Fib head - Ankle 31 45 ?44 20.6     Pop fossa EDB 14.4  6.1  97.4 Pop fossa - Fib head 12 46 ?44 20.1         Pop fossa - Ankle      L Peroneal - EDB     Ankle EDB 6.3 ?6.5 4.7 ?2.0 100 Ankle - EDB 9   12.5     Fib head EDB 13.0  3.7  77.7 Fib head - Ankle 30 44 ?44 11.8     Pop fossa EDB 15.7  3.4  91.7 Pop fossa - Fib head 12 45 ?44 11.7         Pop fossa - Ankle      R Tibial - AH     Ankle AH 4.1 ?5.8 10.2 ?4.0 100 Ankle - AH 9   24.9     Pop fossa AH 13.9  6.7  65.1 Pop fossa - Ankle 40 41 ?41 17.2  L Tibial - AH     Ankle AH 4.4 ?5.8 9.8 ?4.0 100 Ankle - AH 9   31.2     Pop fossa AH 14.3  7.4  75.8 Pop fossa - Ankle 40 41 ?41 29.9             [facility]NC    Nerve / Sites Rec. Site Peak Lat Ref.  Amp Ref. Segments Distance    ms ms V V  cm  R Sural - Ankle (Calf)     Calf Ankle 3.8 ?4.4 12 ?6 Calf - Ankle 14  L Sural - Ankle (Calf)     Calf Ankle 4.0 ?4.4 11 ?6 Calf - Ankle 14  R Superficial peroneal - Ankle     Lat leg Ankle 4.1 ?4.4 7 ?6 Lat leg - Ankle 14  L Superficial peroneal - Ankle     Lat leg Ankle 4.3 ?4.4 7 ?6 Lat leg - Ankle 14              F  Wave    Nerve F Lat Ref.   ms ms  R Tibial - AH 55.8 ?56.0  L Tibial - AH 55.6 ?56.0         EMG full

## 2017-07-14 NOTE — Progress Notes (Signed)
Please refer to EMG and nerve conduction study procedure note. 

## 2017-07-14 NOTE — Procedures (Signed)
     HISTORY:  Ryan Quinn is a 57 year old gentleman with a history of chronic low back pain and pain down the legs, left greater than right.  The patient is being evaluated for leg and foot discomfort, to evaluate for possible peripheral neuropathy or lumbosacral radiculopathy.   NERVE CONDUCTION STUDIES:  Nerve conduction studies were performed on both lower extremities. The distal motor latencies and motor amplitudes for the peroneal and posterior tibial nerves were within normal limits. The nerve conduction velocities for these nerves were also normal. The sensory latencies for the peroneal and sural nerves were within normal limits.  The F-wave latencies for the posterior tibial nerves were within normal limits bilaterally.   EMG STUDIES:  EMG study was performed on the right lower extremity:  The tibialis anterior muscle reveals 2 to 4K motor units with full recruitment. No fibrillations or positive waves were seen. The peroneus tertius muscle reveals 2 to 4K motor units with full recruitment. No fibrillations or positive waves were seen. The medial gastrocnemius muscle reveals 1 to 3K motor units with full recruitment. No fibrillations or positive waves were seen. The vastus lateralis muscle reveals 2 to 4K motor units with full recruitment. No fibrillations or positive waves were seen. The iliopsoas muscle reveals 2 to 4K motor units with full recruitment. No fibrillations or positive waves were seen. The biceps femoris muscle (long head) reveals 2 to 4K motor units with full recruitment. No fibrillations or positive waves were seen. The lumbosacral paraspinal muscles were tested at 3 levels, and revealed no abnormalities of insertional activity at all 3 levels tested. There was good relaxation.  EMG study was performed on the left lower extremity:  The tibialis anterior muscle reveals 2 to 4K motor units with decreased recruitment. No fibrillations or positive waves were  seen. The peroneus tertius muscle reveals 2 to 4K motor units with decreased recruitment. No fibrillations or positive waves were seen. The medial gastrocnemius muscle reveals 1 to 3K motor units with full recruitment. No fibrillations or positive waves were seen. The vastus lateralis muscle reveals 2 to 4K motor units with full recruitment. No fibrillations or positive waves were seen. The iliopsoas muscle reveals 2 to 4K motor units with full recruitment. No fibrillations or positive waves were seen. The biceps femoris muscle (long head) reveals 2 to 4K motor units with full recruitment. No fibrillations or positive waves were seen. The lumbosacral paraspinal muscles were tested at 3 levels, and revealed no abnormalities of insertional activity at all 3 levels tested. There was good relaxation.   IMPRESSION:  Nerve conduction studies done on both lower extremities were unremarkable, no evidence of a peripheral neuropathy was seen.  EMG evaluation of the right lower extremity was within normal limits, without evidence of an overlying lumbosacral radiculopathy.  EMG evaluation of the left lower extremity shows chronic stable signs of denervation in the peroneal nerve distribution muscles suggesting a chronic healed peroneal neuropathy or possibly a mild chronic stable L5 radiculopathy.  Clinical correlation is required.  Marlan Palau MD 07/14/2017 11:18 AM  [facility] [address] [place], [place] [address]  Phone [phone] Fax [phone]

## 2018-01-08 DIAGNOSIS — K4091 Unilateral inguinal hernia, without obstruction or gangrene, recurrent: Secondary | ICD-10-CM | POA: Insufficient documentation

## 2018-01-08 DIAGNOSIS — K4090 Unilateral inguinal hernia, without obstruction or gangrene, not specified as recurrent: Secondary | ICD-10-CM

## 2018-01-08 HISTORY — DX: Unilateral inguinal hernia, without obstruction or gangrene, not specified as recurrent: K40.90

## 2018-01-13 ENCOUNTER — Other Ambulatory Visit: Payer: Self-pay | Admitting: Nurse Practitioner

## 2018-01-13 DIAGNOSIS — B1920 Unspecified viral hepatitis C without hepatic coma: Secondary | ICD-10-CM

## 2018-01-19 ENCOUNTER — Ambulatory Visit
Admission: RE | Admit: 2018-01-19 | Discharge: 2018-01-19 | Disposition: A | Discharge status: Home / Self Care | Payer: Medicare Other | Source: Ambulatory Visit | Attending: Nurse Practitioner | Admitting: Nurse Practitioner

## 2018-01-19 DIAGNOSIS — B1920 Unspecified viral hepatitis C without hepatic coma: Secondary | ICD-10-CM

## 2018-09-14 DIAGNOSIS — K529 Noninfective gastroenteritis and colitis, unspecified: Secondary | ICD-10-CM

## 2018-09-14 HISTORY — DX: Noninfective gastroenteritis and colitis, unspecified: K52.9

## 2019-02-25 DIAGNOSIS — M5416 Radiculopathy, lumbar region: Secondary | ICD-10-CM

## 2019-02-25 HISTORY — DX: Radiculopathy, lumbar region: M54.16

## 2019-03-30 DIAGNOSIS — M5136 Other intervertebral disc degeneration, lumbar region: Secondary | ICD-10-CM

## 2019-03-30 DIAGNOSIS — Z0289 Encounter for other administrative examinations: Secondary | ICD-10-CM

## 2019-03-30 DIAGNOSIS — M51369 Other intervertebral disc degeneration, lumbar region without mention of lumbar back pain or lower extremity pain: Secondary | ICD-10-CM

## 2019-03-30 HISTORY — DX: Other intervertebral disc degeneration, lumbar region: M51.36

## 2019-03-30 HISTORY — DX: Other intervertebral disc degeneration, lumbar region without mention of lumbar back pain or lower extremity pain: M51.369

## 2019-03-30 HISTORY — DX: Encounter for other administrative examinations: Z02.89

## 2019-08-31 DIAGNOSIS — F1020 Alcohol dependence, uncomplicated: Secondary | ICD-10-CM

## 2019-08-31 HISTORY — DX: Alcohol dependence, uncomplicated: F10.20

## 2019-10-19 DIAGNOSIS — K219 Gastro-esophageal reflux disease without esophagitis: Secondary | ICD-10-CM

## 2019-10-19 DIAGNOSIS — M1990 Unspecified osteoarthritis, unspecified site: Secondary | ICD-10-CM | POA: Insufficient documentation

## 2019-10-19 DIAGNOSIS — N400 Enlarged prostate without lower urinary tract symptoms: Secondary | ICD-10-CM

## 2019-10-19 DIAGNOSIS — F419 Anxiety disorder, unspecified: Secondary | ICD-10-CM

## 2019-10-19 DIAGNOSIS — F17200 Nicotine dependence, unspecified, uncomplicated: Secondary | ICD-10-CM | POA: Insufficient documentation

## 2019-10-19 HISTORY — DX: Benign prostatic hyperplasia without lower urinary tract symptoms: N40.0

## 2019-10-19 HISTORY — DX: Anxiety disorder, unspecified: F41.9

## 2019-10-19 HISTORY — DX: Gastro-esophageal reflux disease without esophagitis: K21.9

## 2020-01-11 DIAGNOSIS — R5383 Other fatigue: Secondary | ICD-10-CM

## 2020-01-11 DIAGNOSIS — R5381 Other malaise: Secondary | ICD-10-CM

## 2020-01-11 DIAGNOSIS — F329 Major depressive disorder, single episode, unspecified: Secondary | ICD-10-CM

## 2020-01-11 DIAGNOSIS — Z87438 Personal history of other diseases of male genital organs: Secondary | ICD-10-CM

## 2020-01-11 DIAGNOSIS — F1010 Alcohol abuse, uncomplicated: Secondary | ICD-10-CM | POA: Insufficient documentation

## 2020-01-11 HISTORY — DX: Other malaise: R53.83

## 2020-01-11 HISTORY — DX: Major depressive disorder, single episode, unspecified: F32.9

## 2020-01-11 HISTORY — DX: Other malaise: R53.81

## 2020-01-11 HISTORY — DX: Personal history of other diseases of male genital organs: Z87.438

## 2020-07-08 IMAGING — US US ABDOMEN COMPLETE W/ ELASTOGRAPHY
1 series · 12 of 12 positions shown · non-contrast
Comparison: CT 04/30/2017

CLINICAL DATA: Hepatitis-C



[Series 1: us abdomen complete w/ elastography · 0.15mm/px · 12 of 12 slices shown]
[im 1/12]
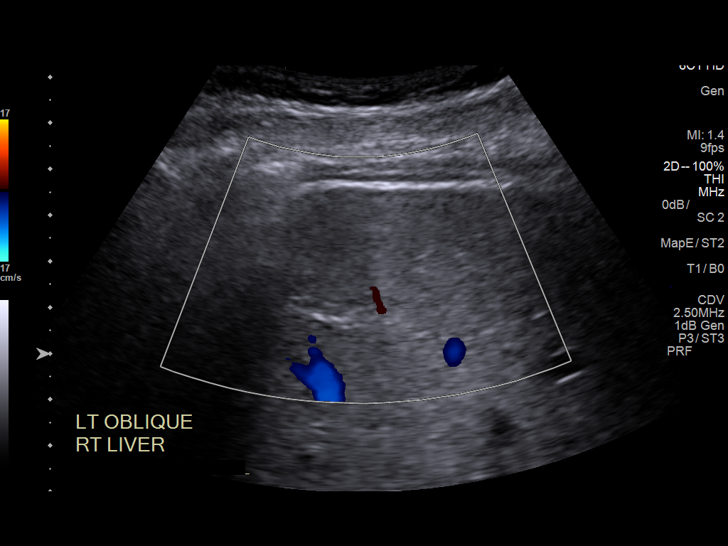
[im 2/12]
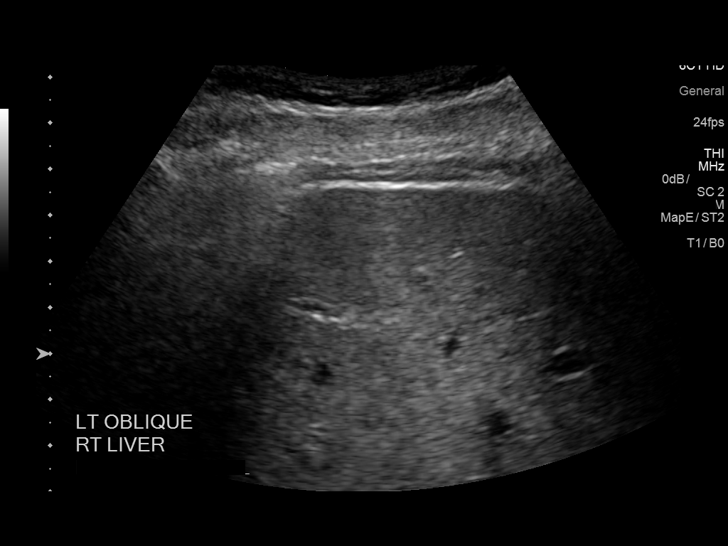
[im 3/12]
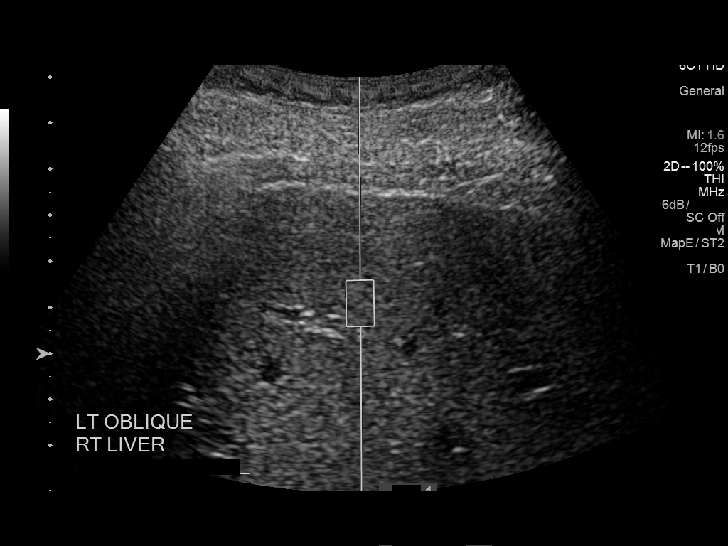
[im 4/12]
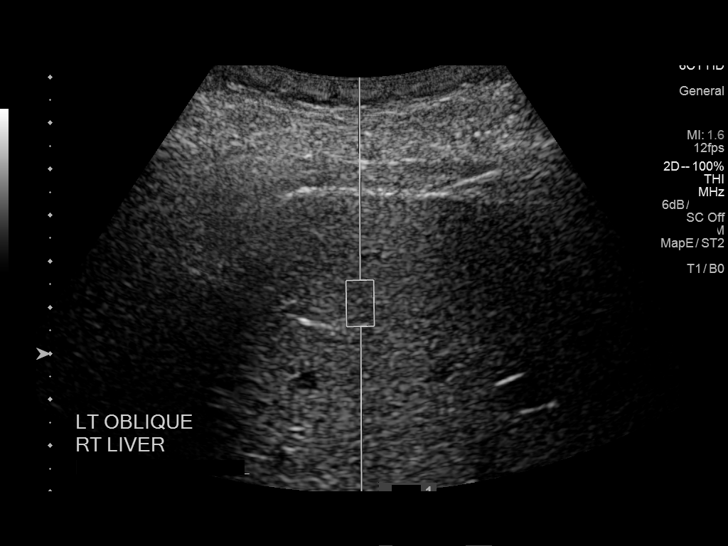
[im 5/12]
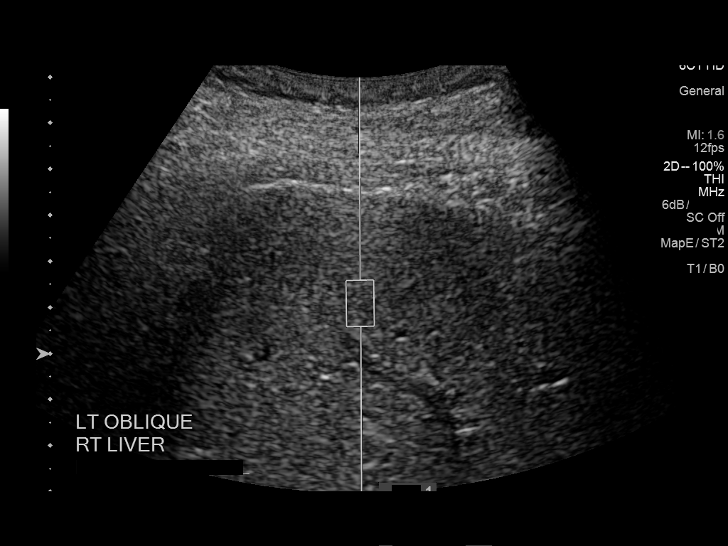
[im 6/12]
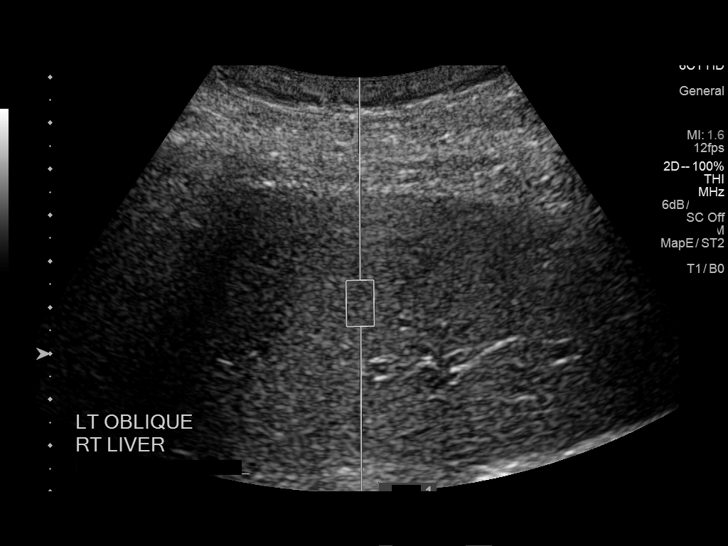
[im 7/12]
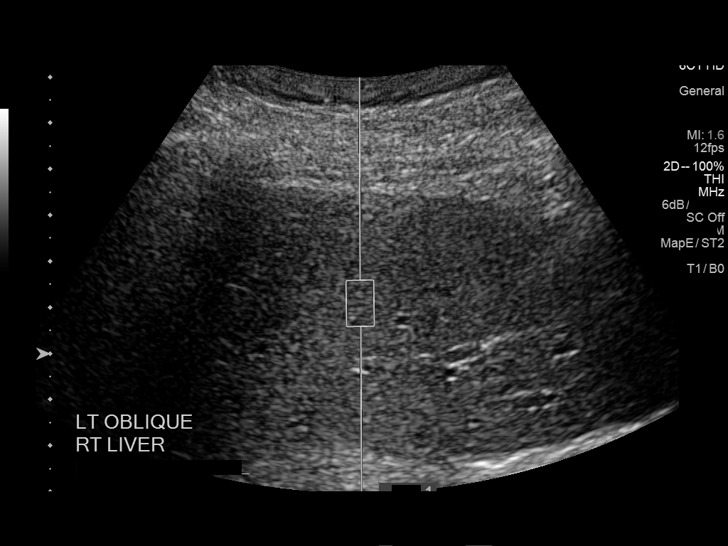
[im 8/12]
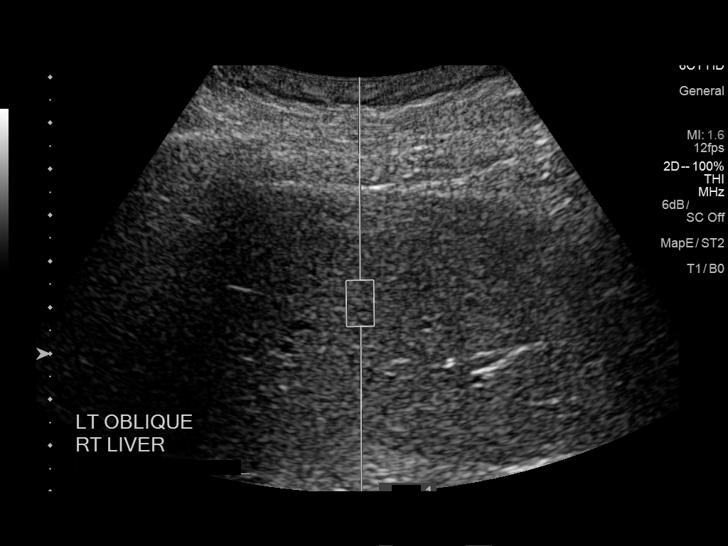
[im 9/12]
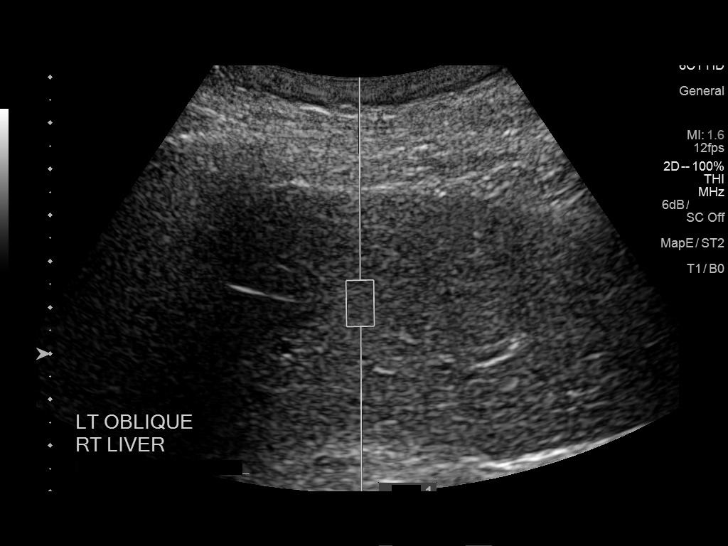
[im 10/12]
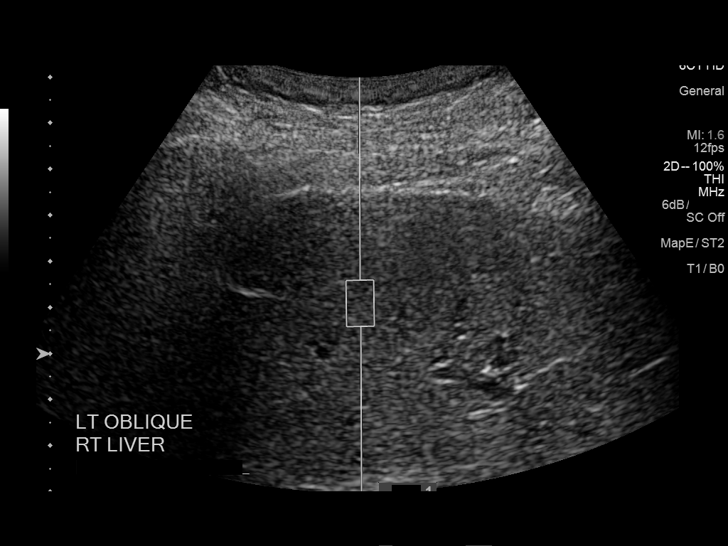
[im 11/12]
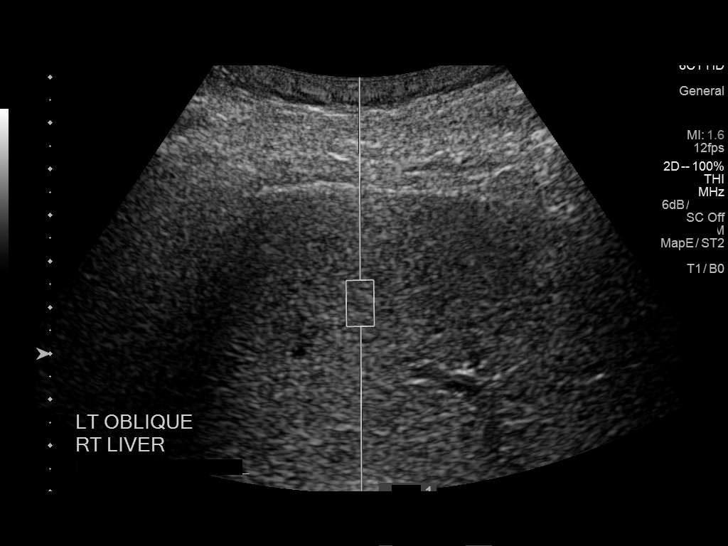
[im 12/12]
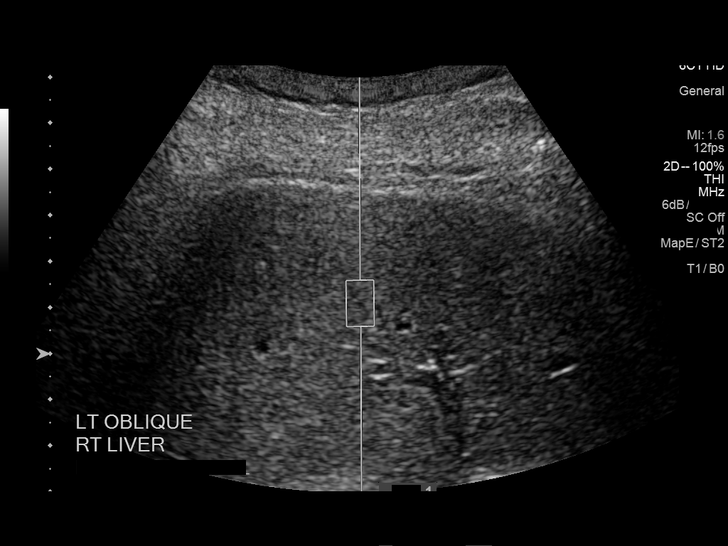

[12 of 12 positions shown; findings below may reference images not displayed]

FINDINGS: ULTRASOUND ABDOMEN

Gallbladder: No gallstones or wall thickening visualized. No
sonographic Murphy sign noted by sonographer.

Common bile duct: Diameter: Dilated, 10 mm. This compares to 12 mm
on prior CT and is therefore stable.

Liver: No focal lesion identified. Within normal limits in
parenchymal echogenicity. Portal vein is patent on color Doppler
imaging with normal direction of blood flow towards the liver.

IVC: Not well visualized due to overlying bowel gas.

Pancreas: Not well visualized due to overlying bowel gas.

Spleen: Normal size.  No focal mass.

Right Kidney: Length: 11.5 cm. Normal echotexture. No focal
abnormality. No hydronephrosis.

Left Kidney: Length: 11.2 cm. Echogenicity within normal limits. No
mass or hydronephrosis visualized.

Abdominal aorta: No aneurysm visualized.

Other findings: None.

ULTRASOUND HEPATIC ELASTOGRAPHY

Device: Siemens Helix VTQ

Patient position: Oblique

Transducer [id number]

Number of measurements: 10

Hepatic segment:  8

Median velocity:   0.85 m/sec

IQR:

IQR/Median velocity ratio:

Corresponding Metavir fibrosis score:  F0/F1

Risk of fibrosis: Minimal

Limitations of exam: None

Please note that abnormal shear wave velocities may also be
identified in clinical settings other than with hepatic fibrosis,
such as: acute hepatitis, elevated right heart and central venous
pressures including use of beta blockers, Ecrin Nisa disease
(Addis), infiltrative processes such as
mastocytosis/amyloidosis/infiltrative tumor, extrahepatic
cholestasis, in the post-prandial state, and liver transplantation.
Correlation with patient history, laboratory data, and clinical
condition recommended.
IMPRESSION: ULTRASOUND ABDOMEN: Stable mild dilatation of the common bile duct.

ULTRASOUND HEPATIC ELASTOGRAPHY:

Median hepatic shear wave velocity is calculated at 0.85 m/sec.

Corresponding Metavir fibrosis score is F0/F1.

Risk of fibrosis is Minimal.

Follow-up: None required

## 2020-07-19 ENCOUNTER — Encounter: Payer: Self-pay | Admitting: *Deleted

## 2020-09-06 ENCOUNTER — Other Ambulatory Visit: Payer: Self-pay

## 2020-09-06 ENCOUNTER — Ambulatory Visit (INDEPENDENT_AMBULATORY_CARE_PROVIDER_SITE_OTHER): Payer: Medicare Other | Admitting: Cardiology

## 2020-09-06 VITALS — BP 114/72 | HR 76 | Ht 73.0 in | Wt 222.6 lb

## 2020-09-06 DIAGNOSIS — R079 Chest pain, unspecified: Secondary | ICD-10-CM

## 2020-09-06 DIAGNOSIS — I2510 Atherosclerotic heart disease of native coronary artery without angina pectoris: Secondary | ICD-10-CM

## 2020-09-06 DIAGNOSIS — E782 Mixed hyperlipidemia: Secondary | ICD-10-CM

## 2020-09-06 DIAGNOSIS — I10 Essential (primary) hypertension: Secondary | ICD-10-CM | POA: Diagnosis not present

## 2020-09-06 DIAGNOSIS — Z950 Presence of cardiac pacemaker: Secondary | ICD-10-CM

## 2020-09-06 DIAGNOSIS — R001 Bradycardia, unspecified: Secondary | ICD-10-CM

## 2020-09-06 NOTE — Progress Notes (Signed)
Cardiology Consultation:    Date:  09/06/2020   ID:  Ryan Quinn, DOB Sep 06, 1960, MRN [id number]  PCP:  Audie Pinto, FNP  Cardiologist:  Gypsy Balsam, MD   Referring MD: Audie Pinto, FNP   Chief Complaint  Patient presents with   Re-establishing    Clearance TBD    Planned hernia repair with Dr. Lennon Alstrom , also will be planning for RTC repair and replacing pacemaker in the future     History of Present Illness:    Ryan Quinn is a 60 y.o. male who is being seen today for the evaluation of cardiac issues at the request of Goins, Gwenith Spitz, FNP.  I took care of him many years ago.  He does have past medical history significant for coronary artery disease.  In 2012 he required PTCA and stenting of the left anterior descending artery.  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  He does have history of COPD, however, likely he quit smoking, essential hypertension, dyslipidemia.  He comes today to my office because he would like to be established as a patient as well as to have his evaluation before incoming hernia surgery done.  Overall he complained of having shortness of breath with exertion.  Sometimes she get tightness in the chest when he walks.  Interestingly he describes a few episode of syncope last episode about 3 months ago when he was walking short of breath became dizzy and completely passed out.  Past Medical History:  Diagnosis Date   Alcoholism (HCC) 08/31/2019   Anxiety 10/19/2019   Arthritis    Benign prostatic hyperplasia 10/19/2019   BRADYCARDIA, CHRONIC 04/23/2010   CAD, NATIVE VESSEL 04/23/2010   Qualifier: Diagnosis of  By: Denyse Amass CMA, Carol     Cardiac arrhythmia 04/23/2010   Formatting of this note might be different from the original. Overview:  Qualifier: Diagnosis of  By: Wendee Copp   Last Assessment & Plan:  His bradycardia is worsening and he is more symptomatic. I have recommended proceeding with PPM. The  risk/benefits/goals/and expectations of the procedure have been discussed with the patient and he wishes to proceed. Formatting of this note might be di   CHEST PAIN, EXERTIONAL 04/23/2010   Qualifier: Diagnosis of  By: Denyse Amass, CMA, Carol     Chronic low back pain 06/10/2017   Colitis 09/14/2018   COPD 04/23/2010   Followed in Pulmonary clinic/ [facility]/ [place]    - HFA 75% effective p coaching 03/23/10   - PFT's 03/27/2011  FEV1  3.29 (85%) ratio 65 and no better p B2 DLCO 105%    - 03/27/2011  Walked RA x 3 laps @ 185 ft each stopped due to End of study, no desat or tachycardia   - CPST rec 03/27/11     Coronary artery disease april 2010   cardiac cath   DDD (degenerative disc disease), lumbar 03/30/2019   Last Assessment & Plan:  Formatting of this note might be different from the original. 60 year old male who has chronic low back pain and right radicular symptoms in an L4-5 distribution.  Review of recently completed plain films show moderate multilevel degenerative disease with facet arthropathy L2-3 through L5-S1.  Again, we have agreed that we will no longer write for narcotic therapies due to   Gastric ulcer with perforation (HCC) 2002   Gastroesophageal reflux disease 10/19/2019   Heart murmur    History of BPH 01/11/2020  Hyperlipidemia    HYPERTENSION 04/23/2010   Try off ACE inhibitors 02/20/2011 due to ? Pseudoasthma > resolved     Inguinal hernia of right side without obstruction or gangrene 01/08/2018   Lumbar radiculopathy 02/25/2019   Last Assessment & Plan:  Formatting of this note might be different from the original. See degenerative disc disease lumbar plan Formatting of this note might be different from the original. Last Assessment & Plan:  Formatting of this note might be different from the original. See degenerative disc disease lumbar plan Formatting of this note might be different from the original. Last Assessment &    Major depression, chronic 01/11/2020   Malaise and  fatigue 01/11/2020   Pacemaker 09/08/2011   Pacemaker reprogramming/check 09/07/2014   Formatting of this note might be different from the original. Last Assessment & Plan:  His device is working normally. Will recheck in several months. St Jude [employer] DR  DOI-  May 31 2011;  Dr Bing Matter (implanted by Dr. Ladona Ridgel) Formatting of this note might be different from the original. Overview:  Last Assessment & Plan:  His device is working normally. Will recheck in several months. St Jude A   Pain medication agreement 03/30/2019   Last Assessment & Plan:  Formatting of this note might be different from the original. Multiple UDS positive for methadone without any reasonable explanation.  He has been offered nonnarcotic therapies only moving forward. Formatting of this note might be different from the original. Last Assessment & Plan:  Formatting of this note might be different from the original. Multiple UDS positive for me   Sinus bradycardia    SOB (shortness of breath)    SSS (sick sinus syndrome) (HCC) 11/23/2015   TOBACCO USE, QUIT 04/23/2010   Qualifier: Diagnosis of  By: Wendee Copp      Past Surgical History:  Procedure Laterality Date   CARDIAC CATHETERIZATION  06/2008   At [facility]. Mild LAD stenosis   CARDIAC CATHETERIZATION  05/2010   95% mid LAD stenosis, 20% RCA stenosis   CORONARY ANGIOPLASTY WITH STENT PLACEMENT  05/2010   Mid LAD: DES: 3.0 X23 mm Promus   HERNIA REPAIR     KNEE SURGERY     LEFT HEART CATHETERIZATION WITH CORONARY ANGIOGRAM N/A 05/15/2011   Procedure: LEFT HEART CATHETERIZATION WITH CORONARY ANGIOGRAM;  Surgeon: Iran Ouch, MD;  Location: [facility] CATH LAB;  Service: Cardiovascular;  Laterality: N/A;   PERMANENT PACEMAKER INSERTION N/A 05/31/2011   Procedure: PERMANENT PACEMAKER INSERTION;  Surgeon: Marinus Maw, MD;  Location: [facility] CATH LAB;  Service: Cardiovascular;  Laterality: N/A;   TONSILLECTOMY     VARICOSE VEIN SURGERY      Current Medications: Current Meds   Medication Sig   acetaminophen (TYLENOL) 325 MG tablet Take 650 mg by mouth every 6 (six) hours as needed for mild pain. For pain   albuterol (PROVENTIL HFA;VENTOLIN HFA) 108 (90 BASE) MCG/ACT inhaler Inhale 2 puffs into the lungs every 4 (four) hours as needed for wheezing or shortness of breath. For shortness of breath   clopidogrel (PLAVIX) 75 MG tablet Take 75 mg by mouth daily.   FLUoxetine (PROZAC) 20 MG capsule Take 40 mg by mouth daily.   fluticasone (FLONASE) 50 MCG/ACT nasal spray Place 1 spray into both nostrils in the morning and at bedtime.   nitroGLYCERIN (NITROSTAT) 0.4 MG SL tablet Place 0.4 mg under the tongue every 5 (five) minutes as needed for chest pain. For chest pain   omeprazole (  PRILOSEC) 20 MG capsule Take 20 mg by mouth daily before breakfast.   QUEtiapine (SEROQUEL) 400 MG tablet Take 400 mg by mouth at bedtime.   tamsulosin (FLOMAX) 0.4 MG CAPS capsule Take 1 capsule by mouth daily.   valsartan-hydrochlorothiazide (DIOVAN-HCT) 320-12.5 MG tablet Take 0.5 tablets by mouth daily. Take 1/2 tablet daily     Allergies:   Codeine, Ibuprofen, and Penicillins   Social History   Socioeconomic History   Marital status: Married    Spouse name: Not on file   Number of children: 3   Years of education: Not on file   Highest education level: Not on file  Occupational History   Occupation: disabled  Tobacco Use   Smoking status: Some Days    Packs/day: 2.00    Years: 25.00    Pack years: 50.00    Types: Cigarettes, Cigars    Last attempt to quit: 08/10/2010    Years since quitting: 10.0   Smokeless tobacco: Never   Tobacco comments:    cigar occasionally  Vaping Use   Vaping Use: Former  Substance and Sexual Activity   Alcohol use: No   Drug use: No   Sexual activity: Not on file  Other Topics Concern   Not on file  Social History Narrative   Lives with mother and wife   Caffeine use: Coffee daily, soda daily   Right handed    Social Determinants of  Health   Financial Resource Strain: Not on file  Food Insecurity: Not on file  Transportation Needs: Not on file  Physical Activity: Not on file  Stress: Not on file  Social Connections: Not on file     Family History: The patient's family history includes Bone cancer in his father; Brain cancer in his father; Heart failure (age of onset: 5357) in his father; Lung cancer in his father; Prostate cancer in his paternal uncle. ROS:   Please see the history of present illness.    All 14 point review of systems negative except as described per history of present illness.  EKGs/Labs/Other Studies Reviewed:    The following studies were reviewed today:   EKG:  EKG is  ordered today.  The ekg ordered today demonstrates normal sinus rhythm, normal P interval, normal QS complex duration morphology no ST segment changes  Recent Labs: No results found for requested labs within last 8760 hours.  Recent Lipid Panel No results found for: CHOL, TRIG, HDL, CHOLHDL, VLDL, LDLCALC, LDLDIRECT  Physical Exam:    VS:  BP 114/72 (BP Location: Right Arm, Patient Position: Sitting)   Pulse 76   Ht 6\' 1"  (1.854 m)   Wt 222 lb 9.6 oz (101 kg)   SpO2 93%   BMI 29.37 kg/m     Wt Readings from Last 3 Encounters:  09/06/20 222 lb 9.6 oz (101 kg)  07/14/20 224 lb (101.6 kg)  06/10/17 208 lb (94.3 kg)     GEN:  Well nourished, well developed in no acute distress HEENT: Normal NECK: No JVD; No carotid bruits LYMPHATICS: No lymphadenopathy CARDIAC: RRR, no murmurs, no rubs, no gallops RESPIRATORY:  Clear to auscultation without rales, wheezing or rhonchi  ABDOMEN: Soft, non-tender, non-distended MUSCULOSKELETAL:  No edema; No deformity  SKIN: Warm and dry NEUROLOGIC:  Alert and oriented x 3 PSYCHIATRIC:  Normal affect   ASSESSMENT:    1. Atherosclerosis of native coronary artery of native heart without angina pectoris   2. Chest pain of uncertain etiology   3.  Primary hypertension   4. Mixed  hyperlipidemia   5. Bradycardia   6. Pacemaker    PLAN:    In order of problems listed above:  Pacemaker present with central device.  I called pacemaker rep trying to get them today to interrogate the device however patient does not want to wait.  He said he will come tomorrow.  I described to him seriousness of the situation will make sure device is functioning properly as patient review of the fact that he gets episode of syncope.  Still he prefers to have it done tomorrow. Chest pain.  It is gentleman with history of coronary artery disease.  Also.  Symptoms are very worrisome.  I recommend to have a stress test done to make sure there is no inducible ischemia History of coronary artery disease as well as shortness of breath I wanted him to have echocardiogram but he inform me today that he will have echocardiogram done tomorrow in pulmonologist office. Mixed dyslipidemia he is not on any statin he tells me his cholesterol is normal I told him that in his situation he should be on cholesterol medication.  We will check his fasting lipid profile and then decide about choice of medication. Cardiovascular preop evaluation again we will look at the echocardiogram that pulmonologist will do as well as we will schedule him to have a stress test and based on that please assess his risk.  What concerns me the most right now is his pacemaker function.   Medication Adjustments/Labs and Tests Ordered: Current medicines are reviewed at length with the patient today.  Concerns regarding medicines are outlined above.  Orders Placed This Encounter  Procedures   MYOCARDIAL PERFUSION IMAGING   EKG 12-Lead   No orders of the defined types were placed in this encounter.   Signed, Georgeanna Lea, MD, [facility]. 09/06/2020 3:24 PM    [facility]

## 2020-09-06 NOTE — Patient Instructions (Signed)
Medication Instructions:  Your physician recommends that you continue on your current medications as directed. Please refer to the Current Medication list given to you today.  *If you need a refill on your cardiac medications before your next appointment, please call your pharmacy*   Lab Work: None If you have labs (blood work) drawn today and your tests are completely normal, you will receive your results only by: MyChart Message (if you have MyChart) OR A paper copy in the mail If you have any lab test that is abnormal or we need to change your treatment, we will call you to review the results.   Testing/Procedures:   [facility] [address] [place], [place] [address] Phone:  [phone]    Please arrive 15 minutes prior to your appointment time for registration and insurance purposes.  The test will take approximately 3 to 4 hours to complete; you may bring reading material.  If someone comes with you to your appointment, they will need to remain in the main lobby due to limited space in the testing area. **If you are pregnant or breastfeeding, please notify the nuclear lab prior to your appointment**  How to prepare for your Myocardial Perfusion Test: Do not eat or drink 3 hours prior to your test, except you may have water. Do not consume products containing caffeine (regular or decaffeinated) 12 hours prior to your test. (ex: coffee, chocolate, sodas, tea). Do bring a list of your current medications with you.  If not listed below, you may take your medications as normal.  Do wear comfortable clothes (no dresses or overalls) and walking shoes, tennis shoes preferred (No heels or open toe shoes are allowed). Do NOT wear cologne, perfume, aftershave, or lotions (deodorant is allowed). If these instructions are not followed, your test will have to be rescheduled.  Please report to [address] for your test.  If you have questions or  concerns about your appointment, you can call the [facility] at [phone].  If you cannot keep your appointment, please provide 24 hours notification to the Nuclear Lab, to avoid a possible $50 charge to your account.    Follow-Up: At [facility], you and your health needs are our priority.  As part of our continuing mission to provide you with exceptional heart care, we have created designated Provider Care Teams.  These Care Teams include your primary Cardiologist (physician) and Advanced Practice Providers (APPs -  Physician Assistants and Nurse Practitioners) who all work together to provide you with the care you need, when you need it.  We recommend signing up for the patient portal called "MyChart".  Sign up information is provided on this After Visit Summary.  MyChart is used to connect with patients for Virtual Visits (Telemedicine).  Patients are able to view lab/test results, encounter notes, upcoming appointments, etc.  Non-urgent messages can be sent to your provider as well.   To learn more about what you can do with MyChart, go to [email or web address].    Your next appointment:   2 month(s)  The format for your next appointment:   In Person  Provider:   Gypsy Balsam, MD   Other Instructions  Please come tomorrow 09/07/20 at 11 am for pacemaker interrogation

## 2020-09-07 ENCOUNTER — Telehealth: Payer: Self-pay | Admitting: *Deleted

## 2020-09-07 NOTE — Addendum Note (Signed)
Addended by: Hazle Quant on: 09/07/2020 11:18 AM   Modules accepted: Orders

## 2020-09-07 NOTE — Telephone Encounter (Signed)
Patient given detailed instructions per Myocardial Perfusion Study Information Sheet for the test on 09/12/20 at 0815. Patient notified to arrive 15 minutes early and that it is imperative to arrive on time for appointment to keep from having the test rescheduled.  If you need to cancel or reschedule your appointment, please call the office within 24 hours of your appointment. . Patient verbalized understanding.Ryan Quinn, Adelene Idler No mychart available.

## 2020-09-14 ENCOUNTER — Other Ambulatory Visit: Payer: Self-pay

## 2020-09-14 ENCOUNTER — Ambulatory Visit (INDEPENDENT_AMBULATORY_CARE_PROVIDER_SITE_OTHER): Payer: Medicare Other

## 2020-09-14 DIAGNOSIS — I10 Essential (primary) hypertension: Secondary | ICD-10-CM

## 2020-09-14 DIAGNOSIS — R079 Chest pain, unspecified: Secondary | ICD-10-CM | POA: Diagnosis not present

## 2020-09-14 DIAGNOSIS — Z950 Presence of cardiac pacemaker: Secondary | ICD-10-CM

## 2020-09-14 DIAGNOSIS — I2510 Atherosclerotic heart disease of native coronary artery without angina pectoris: Secondary | ICD-10-CM

## 2020-09-14 DIAGNOSIS — E782 Mixed hyperlipidemia: Secondary | ICD-10-CM | POA: Diagnosis not present

## 2020-09-14 DIAGNOSIS — R001 Bradycardia, unspecified: Secondary | ICD-10-CM

## 2020-09-14 LAB — MYOCARDIAL PERFUSION IMAGING
LV dias vol: 112 mL (ref 62–150)
LV sys vol: 45 mL
Peak HR: 78 {beats}/min
Rest HR: 60 {beats}/min
SDS: 0
SRS: 13
SSS: 13
TID: 1.23

## 2020-09-14 LAB — ECHOCARDIOGRAM COMPLETE
Calc EF: 50.4 %
Height: 73 in
S' Lateral: 3.4 cm
Single Plane A2C EF: 52.4 %
Single Plane A4C EF: 53.6 %
Weight: 3552 oz

## 2020-09-14 MED ORDER — AMINOPHYLLINE 25 MG/ML IV SOLN: 75.0000 mg | Freq: Once | INTRAVENOUS | Status: AC

## 2020-09-14 MED ORDER — REGADENOSON 0.4 MG/5ML IV SOLN
0.4000 mg | Freq: Once | INTRAVENOUS | Status: AC
  Administered 2020-09-14: 0.4 mg via INTRAVENOUS

## 2020-09-14 MED ORDER — TECHNETIUM TC 99M TETROFOSMIN IV KIT
32.4000 | PACK | Freq: Once | INTRAVENOUS | Status: AC | PRN
  Administered 2020-09-14: 32.4 via INTRAVENOUS

## 2020-09-14 MED ORDER — TECHNETIUM TC 99M TETROFOSMIN IV KIT
10.3000 | PACK | Freq: Once | INTRAVENOUS | Status: AC | PRN
  Administered 2020-09-14: 10.3 via INTRAVENOUS

## 2020-09-15 ENCOUNTER — Telehealth: Payer: Self-pay

## 2020-09-15 NOTE — Telephone Encounter (Signed)
-----   Message from Georgeanna Lea, MD sent at 09/15/2020 12:35 PM EDT ----- Stress test showing no ischemia

## 2020-09-15 NOTE — Telephone Encounter (Signed)
Spoke with patient regarding results and recommendation.  Patient verbalizes understanding and is agreeable to plan of care. Advised patient to call back with any issues or concerns.  

## 2020-09-15 NOTE — Telephone Encounter (Signed)
-----   Message from Felecia Jan, RN sent at 09/15/2020  1:49 PM EDT -----  ----- Message ----- From: Georgeanna Lea, MD Sent: 09/15/2020  12:35 PM EDT To: Hazle Quant, RN  Echocardiogram showed low normal ejection fraction, mild left ventricle hypertrophy, mild mitral valve regurgitation, none of this is dangerous.

## 2020-09-20 ENCOUNTER — Telehealth: Payer: Self-pay | Admitting: Cardiology

## 2020-09-20 NOTE — Telephone Encounter (Signed)
New message:      Joni Reining from [employer] calling to results sent over to [phone] fax number

## 2020-09-20 NOTE — Telephone Encounter (Signed)
Left message for Nicole to return my call.

## 2020-10-05 ENCOUNTER — Other Ambulatory Visit: Payer: Self-pay

## 2020-10-05 DIAGNOSIS — Z950 Presence of cardiac pacemaker: Secondary | ICD-10-CM

## 2020-10-12 ENCOUNTER — Other Ambulatory Visit: Payer: Self-pay

## 2020-11-20 ENCOUNTER — Ambulatory Visit (INDEPENDENT_AMBULATORY_CARE_PROVIDER_SITE_OTHER): Payer: Medicare Other | Admitting: Cardiology

## 2020-11-20 ENCOUNTER — Encounter: Payer: Self-pay | Admitting: Cardiology

## 2020-11-20 ENCOUNTER — Other Ambulatory Visit: Payer: Self-pay

## 2020-11-20 VITALS — BP 142/72 | HR 71 | Ht 73.0 in | Wt 228.2 lb

## 2020-11-20 DIAGNOSIS — Z950 Presence of cardiac pacemaker: Secondary | ICD-10-CM

## 2020-11-20 NOTE — Patient Instructions (Signed)
Medication Instructions:  Your physician recommends that you continue on your current medications as directed. Please refer to the Current Medication list given to you today.  *If you need a refill on your cardiac medications before your next appointment, please call your pharmacy*   Lab Work: None ordered If you have labs (blood work) drawn today and your tests are completely normal, you will receive your results only by: MyChart Message (if you have MyChart) OR A paper copy in the mail If you have any lab test that is abnormal or we need to change your treatment, we will call you to review the results.   Testing/Procedures: None ordered   Follow-Up: At [facility], you and your health needs are our priority.  As part of our continuing mission to provide you with exceptional heart care, we have created designated Provider Care Teams.  These Care Teams include your primary Cardiologist (physician) and Advanced Practice Providers (APPs -  Physician Assistants and Nurse Practitioners) who all work together to provide you with the care you need, when you need it.  Your next appointment:   1 month(s)  The format for your next appointment:   In Person  Provider:   Device clinic for a battery check    Thank you for choosing [facility]!!   Dory Horn, RN [phone]   Other Instructions \

## 2020-11-20 NOTE — Progress Notes (Signed)
Electrophysiology Office Note   Date:  11/20/2020   ID:  Ryan Quinn, DOB 05/23/1960, MRN [id number]  PCP:  Audie PintoGoins, Karen C, FNP  Cardiologist:  Skip EstimableKrasowaki Primary Electrophysiologist:  Yehya Brendle Jorja LoaMartin Rustin Erhart, MD    Chief Complaint: pacemaker   History of Present Illness: Ryan Quinn is a 60 y.o. male who is being seen today for the evaluation of pacemaker at the request of Georgeanna LeaKrasowski, Robert J, MD. Presenting today for electrophysiology evaluation.  He has a history significant for coronary artery disease status post LAD stent in 2013, COPD, essential hypertension, hyperlipidemia.  Around the time of his LAD stent, he had a Saint Jude dual-chamber pacemaker implanted.  Today, he denies symptoms of palpitations, chest pain, shortness of breath, orthopnea, PND, lower extremity edema, claudication, dizziness, presyncope, syncope, bleeding, or neurologic sequela. The patient is tolerating medications without difficulties.  He currently feels well.  He has no chest pain or shortness of breath.  He is able do all of his daily activities.  He has had no issues with his pacemaker since it was implanted.  He continues to be quite active.   Past Medical History:  Diagnosis Date   Alcoholism (HCC) 08/31/2019   Anxiety 10/19/2019   Arthritis    Benign prostatic hyperplasia 10/19/2019   BRADYCARDIA, CHRONIC 04/23/2010   CAD, NATIVE VESSEL 04/23/2010   Qualifier: Diagnosis of  By: Denyse AmassFiato, CMA, Carol     Cardiac arrhythmia 04/23/2010   Formatting of this note might be different from the original. Overview:  Qualifier: Diagnosis of  By: Wendee CoppFiato, CMA, Carol   Last Assessment & Plan:  His bradycardia is worsening and he is more symptomatic. I have recommended proceeding with PPM. The risk/benefits/goals/and expectations of the procedure have been discussed with the patient and he wishes to proceed. Formatting of this note might be di   CHEST PAIN, EXERTIONAL 04/23/2010   Qualifier: Diagnosis of  By: Denyse AmassFiato, CMA,  Carol     Chronic low back pain 06/10/2017   Colitis 09/14/2018   COPD 04/23/2010   Followed in Pulmonary clinic/ [facility]/ [facility]    - HFA 75% effective p coaching 03/23/10   - PFT's 03/27/2011  FEV1  3.29 (85%) ratio 65 and no better p B2 DLCO 105%    - 03/27/2011  Walked RA x 3 laps @ 185 ft each stopped due to End of study, no desat or tachycardia   - CPST rec 03/27/11     Coronary artery disease april 2010   cardiac cath   DDD (degenerative disc disease), lumbar 03/30/2019   Last Assessment & Plan:  Formatting of this note might be different from the original. 60 year old male who has chronic low back pain and right radicular symptoms in an L4-5 distribution.  Review of recently completed plain films show moderate multilevel degenerative disease with facet arthropathy L2-3 through L5-S1.  Again, we have agreed that we Jaquille Kau no longer write for narcotic therapies due to   Gastric ulcer with perforation (HCC) 2002   Gastroesophageal reflux disease 10/19/2019   Heart murmur    History of BPH 01/11/2020   Hyperlipidemia    HYPERTENSION 04/23/2010   Try off ACE inhibitors 02/20/2011 due to ? Pseudoasthma > resolved     Inguinal hernia of right side without obstruction or gangrene 01/08/2018   Lumbar radiculopathy 02/25/2019   Last Assessment & Plan:  Formatting of this note might be different from the original. See degenerative disc disease lumbar plan Formatting of this note  might be different from the original. Last Assessment & Plan:  Formatting of this note might be different from the original. See degenerative disc disease lumbar plan Formatting of this note might be different from the original. Last Assessment &    Major depression, chronic 01/11/2020   Malaise and fatigue 01/11/2020   Pacemaker 09/08/2011   Pacemaker reprogramming/check 09/07/2014   Formatting of this note might be different from the original. Last Assessment & Plan:  His device is working normally. Elvert Cumpton recheck in several  months. St Jude [employer] DR  DOI-  May 31 2011;  Dr Bing Matter (implanted by Dr. Ladona Ridgel) Formatting of this note might be different from the original. Overview:  Last Assessment & Plan:  His device is working normally. Latysha Thackston recheck in several months. St Jude A   Pain medication agreement 03/30/2019   Last Assessment & Plan:  Formatting of this note might be different from the original. Multiple UDS positive for methadone without any reasonable explanation.  He has been offered nonnarcotic therapies only moving forward. Formatting of this note might be different from the original. Last Assessment & Plan:  Formatting of this note might be different from the original. Multiple UDS positive for me   Sinus bradycardia    SOB (shortness of breath)    SSS (sick sinus syndrome) (HCC) 11/23/2015   TOBACCO USE, QUIT 04/23/2010   Qualifier: Diagnosis of  By: Wendee Copp     Past Surgical History:  Procedure Laterality Date   CARDIAC CATHETERIZATION  06/2008   At [facility]. Mild LAD stenosis   CARDIAC CATHETERIZATION  05/2010   95% mid LAD stenosis, 20% RCA stenosis   CORONARY ANGIOPLASTY WITH STENT PLACEMENT  05/2010   Mid LAD: DES: 3.0 X23 mm Promus   HERNIA REPAIR     KNEE SURGERY     LEFT HEART CATHETERIZATION WITH CORONARY ANGIOGRAM N/A 05/15/2011   Procedure: LEFT HEART CATHETERIZATION WITH CORONARY ANGIOGRAM;  Surgeon: Iran Ouch, MD;  Location: [facility] CATH LAB;  Service: Cardiovascular;  Laterality: N/A;   PERMANENT PACEMAKER INSERTION N/A 05/31/2011   Procedure: PERMANENT PACEMAKER INSERTION;  Surgeon: Marinus Maw, MD;  Location: [facility] CATH LAB;  Service: Cardiovascular;  Laterality: N/A;   TONSILLECTOMY     VARICOSE VEIN SURGERY       Current Outpatient Medications  Medication Sig Dispense Refill   acetaminophen (TYLENOL) 325 MG tablet Take 650 mg by mouth every 6 (six) hours as needed for mild pain. For pain     albuterol (PROVENTIL HFA;VENTOLIN HFA) 108 (90 BASE) MCG/ACT inhaler Inhale 2  puffs into the lungs every 4 (four) hours as needed for wheezing or shortness of breath. For shortness of breath     clopidogrel (PLAVIX) 75 MG tablet Take 75 mg by mouth daily.     FLUoxetine (PROZAC) 20 MG capsule Take 40 mg by mouth daily.     fluticasone (FLONASE) 50 MCG/ACT nasal spray Place 1 spray into both nostrils in the morning and at bedtime.     nitroGLYCERIN (NITROSTAT) 0.4 MG SL tablet Place 0.4 mg under the tongue every 5 (five) minutes as needed for chest pain. For chest pain     omeprazole (PRILOSEC) 20 MG capsule Take 20 mg by mouth daily before breakfast.     QUEtiapine (SEROQUEL) 400 MG tablet Take 400 mg by mouth at bedtime.     tamsulosin (FLOMAX) 0.4 MG CAPS capsule Take 1 capsule by mouth daily.     valsartan-hydrochlorothiazide (DIOVAN-HCT) 320-12.5 MG  tablet Take 0.5 tablets by mouth daily. Take 1/2 tablet daily     Current Facility-Administered Medications  Medication Dose Route Frequency Provider Last Rate Last Admin   aminophylline injection 75 mg  75 mg Intravenous Once Georgeanna Lea, MD        Allergies:   Codeine, Ibuprofen, and Penicillins   Social History:  The patient  reports that he has been smoking cigarettes and cigars. He has a 50.00 pack-year smoking history. He has never used smokeless tobacco. He reports that he does not drink alcohol and does not use drugs.   Family History:  The patient's family history includes Bone cancer in his father; Brain cancer in his father; Heart failure (age of onset: 80) in his father; Lung cancer in his father; Prostate cancer in his paternal uncle.    ROS:  Please see the history of present illness.   Otherwise, review of systems is positive for none.   All other systems are reviewed and negative.    PHYSICAL EXAM: VS:  BP (!) 142/72   Pulse 71   Ht 6\' 1"  (1.854 m)   Wt 228 lb 3.2 oz (103.5 kg)   SpO2 95%   BMI 30.11 kg/m  , BMI Body mass index is 30.11 kg/m. GEN: Well nourished, well developed, in no  acute distress  HEENT: normal  Neck: no JVD, carotid bruits, or masses Cardiac: RRR; no murmurs, rubs, or gallops,no edema  Respiratory:  clear to auscultation bilaterally, normal work of breathing GI: soft, nontender, nondistended, + BS MS: no deformity or atrophy  Skin: warm and dry, device pocket is well healed Neuro:  Strength and sensation are intact Psych: euthymic mood, full affect  EKG:  EKG is not ordered today. Personal review of the ekg ordered 09/06/2020 shows sinus rhythm, rate 75  Device interrogation is reviewed today in detail.  See PaceArt for details.   Recent Labs: No results found for requested labs within last 8760 hours.    Lipid Panel  No results found for: CHOL, TRIG, HDL, CHOLHDL, VLDL, LDLCALC, LDLDIRECT   Wt Readings from Last 3 Encounters:  11/20/20 228 lb 3.2 oz (103.5 kg)  09/14/20 222 lb (100.7 kg)  09/06/20 222 lb 9.6 oz (101 kg)      Other studies Reviewed: Additional studies/ records that were reviewed today include: TTE 09/14/20  Review of the above records today demonstrates:   1. Left ventricular ejection fraction, by estimation, is 50 to 55%. The  left ventricle has low normal function. The left ventricle has no regional  wall motion abnormalities. There is mild left ventricular hypertrophy.  Left ventricular diastolic  parameters were normal.   2. Right ventricular systolic function is normal. The right ventricular  size is normal. There is normal pulmonary artery systolic pressure.   3. The mitral valve is normal in structure. Mild mitral valve  regurgitation. No evidence of mitral stenosis.   4. The aortic valve is normal in structure. Aortic valve regurgitation is  not visualized. No aortic stenosis is present.   5. The inferior vena cava is normal in size with greater than 50%  respiratory variability, suggesting right atrial pressure of 3 mmHg.    ASSESSMENT AND PLAN:  1.  Pacemaker: Status post Saint Jude dual-chamber  pacemaker implanted in 2013 for potentially sick sinus syndrome.  Device is functioning appropriately.  He has 3 months left till ERI.  We discussed generator change.  Risks and benefits were discussed include bleeding and infection.  He understand these risks and has agreed to the procedure.  In the interim, we Nyna Chilton enroll him in device clinic.  2.  Coronary artery disease: Status post LAD stent.  No current chest pain.  3.  Hypertension: Mildly elevated today.  Has been more normal in the past.  Continue Diovan/HCTZ 320/12.5 mg daily.    Current medicines are reviewed at length with the patient today.   The patient does not have concerns regarding his medicines.  The following changes were made today:  none  Labs/ tests ordered today include:  No orders of the defined types were placed in this encounter.    Disposition:   FU with Dolton Shaker 1 year  Signed, Codie Hainer Jorja Loa, MD  11/20/2020 3:20 PM     [facility] [address] [place] [address] [phone] (office) [phone] (fax)

## 2020-11-21 ENCOUNTER — Telehealth: Payer: Self-pay

## 2020-11-21 NOTE — Telephone Encounter (Signed)
Successful telephone encounter to patient to discuss monthly in-clinic battery checks as patient close to ERI at [facility] with Dr. Elberta Fortis. Patient is scheduled 12/11/20 and 01/08/21 device clinic appointments at [facility] at 8:00 am for battery checks. Patient appreciative of call.

## 2020-12-11 ENCOUNTER — Ambulatory Visit (INDEPENDENT_AMBULATORY_CARE_PROVIDER_SITE_OTHER): Payer: Medicare Other | Admitting: Cardiology

## 2020-12-11 ENCOUNTER — Other Ambulatory Visit: Payer: Self-pay

## 2020-12-11 DIAGNOSIS — I495 Sick sinus syndrome: Secondary | ICD-10-CM

## 2020-12-11 NOTE — Progress Notes (Signed)
Pacemaker check in clinic. Normal device function. Thresholds, sensing, impedances consistent with previous measurements. Device programmed to maximize longevity. No mode switch or high ventricular rates noted. Device programmed at appropriate safety margins. Histogram distribution appropriate for patient activity level. Device programmed to optimize intrinsic conduction. Battery at [facility] per voltage. Will be scheduled for gen change. Patient education completed.

## 2020-12-11 NOTE — Patient Instructions (Signed)
We will call you with a date, time, and instructions for your device battery change

## 2020-12-27 ENCOUNTER — Telehealth: Payer: Self-pay | Admitting: Cardiology

## 2020-12-27 NOTE — Telephone Encounter (Signed)
On 10/3 PPM generator battery 2.6V Pt would like to have battery change 11/9. Aware I will be in touch to go over instructions Pt agreeable to plan.

## 2020-12-27 NOTE — Telephone Encounter (Signed)
Pt would like to know when he will be scheduled to go into the hospital... states that the generator on his heart is dead and he needs to have this replaced... please advise surgery of procedure date

## 2021-01-01 ENCOUNTER — Encounter: Payer: Self-pay | Admitting: *Deleted

## 2021-01-01 NOTE — Telephone Encounter (Signed)
Ryan Quinn is calling requesting to speak with Ryan Quinn today about what time he is scheduled for having his battery replaced on 11/09. He states he has already arranged transportation, but never heard back from her about it.

## 2021-01-01 NOTE — Telephone Encounter (Signed)
Patient calling back. He is very frustrated that he has not had a call back yet.

## 2021-01-01 NOTE — Telephone Encounter (Signed)
Left message to call back  Attempted to reach pt on other number listed, turned out to be his wife. She will relay message that I will be in touch to go over instructions this week. She appreciates the call.

## 2021-01-03 NOTE — Telephone Encounter (Signed)
Left detailed message informing pt that I would call him this week

## 2021-01-03 NOTE — Telephone Encounter (Signed)
Patient returned call, please call his cell number, not his wife.

## 2021-01-05 NOTE — Telephone Encounter (Signed)
Reviewed procedure instructions with pateitn. Aware NPO after MN night before. Advised to arrive at 12:30 pm to [facility] on 01/17/21 for PPM gen change. Advised to hold all medications morning of this procedure. Patient verbalized understanding and agreeable to plan.

## 2021-01-09 ENCOUNTER — Ambulatory Visit: Payer: Medicare Other | Admitting: Cardiology

## 2021-01-16 NOTE — Pre-Procedure Instructions (Signed)
Attempted to call patient regarding procedure instructions.  No answer 

## 2021-01-17 ENCOUNTER — Encounter (HOSPITAL_COMMUNITY)
Admission: RE | Disposition: A | Discharge status: Home / Self Care | Payer: Self-pay | Source: Home / Self Care | Attending: Cardiology

## 2021-01-17 ENCOUNTER — Other Ambulatory Visit: Payer: Self-pay

## 2021-01-17 ENCOUNTER — Ambulatory Visit (HOSPITAL_COMMUNITY)
Admission: RE | Admit: 2021-01-17 | Discharge: 2021-01-17 | Disposition: A | Discharge status: Home / Self Care | Payer: Medicare Other | Attending: Cardiology | Admitting: Cardiology

## 2021-01-17 DIAGNOSIS — J449 Chronic obstructive pulmonary disease, unspecified: Secondary | ICD-10-CM | POA: Diagnosis not present

## 2021-01-17 DIAGNOSIS — Z955 Presence of coronary angioplasty implant and graft: Secondary | ICD-10-CM | POA: Insufficient documentation

## 2021-01-17 DIAGNOSIS — Z45010 Encounter for checking and testing of cardiac pacemaker pulse generator [battery]: Secondary | ICD-10-CM | POA: Diagnosis present

## 2021-01-17 DIAGNOSIS — I495 Sick sinus syndrome: Secondary | ICD-10-CM

## 2021-01-17 DIAGNOSIS — R001 Bradycardia, unspecified: Secondary | ICD-10-CM | POA: Insufficient documentation

## 2021-01-17 DIAGNOSIS — I10 Essential (primary) hypertension: Secondary | ICD-10-CM | POA: Diagnosis not present

## 2021-01-17 DIAGNOSIS — E785 Hyperlipidemia, unspecified: Secondary | ICD-10-CM | POA: Insufficient documentation

## 2021-01-17 DIAGNOSIS — I2510 Atherosclerotic heart disease of native coronary artery without angina pectoris: Secondary | ICD-10-CM | POA: Insufficient documentation

## 2021-01-17 HISTORY — PX: PPM GENERATOR CHANGEOUT: EP1233

## 2021-01-17 SURGERY — PPM GENERATOR CHANGEOUT

## 2021-01-17 MED ORDER — ONDANSETRON HCL 4 MG/2ML IJ SOLN: 4.0000 mg | Freq: Four times a day (QID) | INTRAMUSCULAR | Status: DC | PRN

## 2021-01-17 MED ORDER — SODIUM CHLORIDE 0.9 % IV SOLN
INTRAVENOUS | Status: AC
  Filled 2021-01-17: qty 2

## 2021-01-17 MED ORDER — VANCOMYCIN HCL 1500 MG/300ML IV SOLN
1500.0000 mg | INTRAVENOUS | Status: AC
  Administered 2021-01-17: 1500 mg via INTRAVENOUS
  Filled 2021-01-17: qty 300

## 2021-01-17 MED ORDER — SODIUM CHLORIDE 0.9 % IV SOLN
INTRAVENOUS | Status: DC | PRN
  Administered 2021-01-17: 80 mL

## 2021-01-17 MED ORDER — ACETAMINOPHEN 325 MG PO TABS: 325.0000 mg | ORAL_TABLET | ORAL | Status: DC | PRN

## 2021-01-17 MED ORDER — LIDOCAINE HCL (PF) 1 % IJ SOLN
INTRAMUSCULAR | Status: AC
  Filled 2021-01-17: qty 60

## 2021-01-17 MED ORDER — LIDOCAINE HCL (PF) 1 % IJ SOLN
INTRAMUSCULAR | Status: DC | PRN
  Administered 2021-01-17: 60 mL via INTRADERMAL

## 2021-01-17 MED ORDER — SODIUM CHLORIDE 0.9 % IV SOLN: 80.0000 mg | INTRAVENOUS | Status: DC

## 2021-01-17 MED ORDER — SODIUM CHLORIDE 0.9 % IV SOLN: INTRAVENOUS | Status: DC

## 2021-01-17 MED ORDER — CHLORHEXIDINE GLUCONATE 4 % EX LIQD
4.0000 "application " | Freq: Once | CUTANEOUS | Status: DC
  Filled 2021-01-17: qty 60

## 2021-01-17 SURGICAL SUPPLY — 4 items
CABLE SURGICAL S-101-97-12 (CABLE) ×2 IMPLANT
PACEMAKER ASSURITY DR-RF (Pacemaker) ×2 IMPLANT
PAD PRO RADIOLUCENT 2001M-C (PAD) ×2 IMPLANT
TRAY PACEMAKER INSERTION (PACKS) ×2 IMPLANT

## 2021-01-17 NOTE — H&P (Signed)
Electrophysiology Office Note   Date:  01/17/2021   ID:  Ryan Quinn, DOB 07/24/1960, MRN [id number]  PCP:  Audie Pinto, FNP  Cardiologist:  Skip Estimable Primary Electrophysiologist:  Kedar Sedano Jorja Loa, MD    Chief Complaint: pacemaker   History of Present Illness: Ryan Quinn is a 60 y.o. male who is being seen today for the evaluation of pacemaker at the request of No ref. provider found. Presenting today for electrophysiology evaluation.  He has a history significant for coronary artery disease status post LAD stent in 2013, COPD, essential hypertension, hyperlipidemia.  Around the time of his LAD stent, he had a Saint Jude dual-chamber pacemaker implanted.  Today, denies symptoms of palpitations, chest pain, shortness of breath, orthopnea, PND, lower extremity edema, claudication, dizziness, presyncope, syncope, bleeding, or neurologic sequela. The patient is tolerating medications without difficulties. Pacemaker gen change today.   Past Medical History:  Diagnosis Date   Alcoholism (HCC) 08/31/2019   Anxiety 10/19/2019   Arthritis    Benign prostatic hyperplasia 10/19/2019   BRADYCARDIA, CHRONIC 04/23/2010   CAD, NATIVE VESSEL 04/23/2010   Qualifier: Diagnosis of  By: Denyse Amass CMA, Carol     Cardiac arrhythmia 04/23/2010   Formatting of this note might be different from the original. Overview:  Qualifier: Diagnosis of  By: Wendee Copp   Last Assessment & Plan:  His bradycardia is worsening and he is more symptomatic. I have recommended proceeding with PPM. The risk/benefits/goals/and expectations of the procedure have been discussed with the patient and he wishes to proceed. Formatting of this note might be di   CHEST PAIN, EXERTIONAL 04/23/2010   Qualifier: Diagnosis of  By: Denyse Amass, CMA, Carol     Chronic low back pain 06/10/2017   Colitis 09/14/2018   COPD 04/23/2010   Followed in Pulmonary clinic/ [facility]/ [facility]    - HFA 75% effective p coaching 03/23/10   - PFT's  03/27/2011  FEV1  3.29 (85%) ratio 65 and no better p B2 DLCO 105%    - 03/27/2011  Walked RA x 3 laps @ 185 ft each stopped due to End of study, no desat or tachycardia   - CPST rec 03/27/11     Coronary artery disease april 2010   cardiac cath   DDD (degenerative disc disease), lumbar 03/30/2019   Last Assessment & Plan:  Formatting of this note might be different from the original. 60 year old male who has chronic low back pain and right radicular symptoms in an L4-5 distribution.  Review of recently completed plain films show moderate multilevel degenerative disease with facet arthropathy L2-3 through L5-S1.  Again, we have agreed that we Bryn Saline no longer write for narcotic therapies due to   Gastric ulcer with perforation (HCC) 2002   Gastroesophageal reflux disease 10/19/2019   Heart murmur    History of BPH 01/11/2020   Hyperlipidemia    HYPERTENSION 04/23/2010   Try off ACE inhibitors 02/20/2011 due to ? Pseudoasthma > resolved     Inguinal hernia of right side without obstruction or gangrene 01/08/2018   Lumbar radiculopathy 02/25/2019   Last Assessment & Plan:  Formatting of this note might be different from the original. See degenerative disc disease lumbar plan Formatting of this note might be different from the original. Last Assessment & Plan:  Formatting of this note might be different from the original. See degenerative disc disease lumbar plan Formatting of this note might be different from the original. Last Assessment &  Major depression, chronic 01/11/2020   Malaise and fatigue 01/11/2020   Pacemaker 09/08/2011   Pacemaker reprogramming/check 09/07/2014   Formatting of this note might be different from the original. Last Assessment & Plan:  His device is working normally. Ravynn Hogate recheck in several months. St Jude Accent DR  DOI-  May 31 2011;  Dr Agustin Cree (implanted by Dr. Lovena Le) [place] of this note might be different from the original. Overview:  Last Assessment & Plan:  His device  is working normally. Janaysia Mcleroy recheck in several months. St Jude A   Pain medication agreement 03/30/2019   Last Assessment & Plan:  Formatting of this note might be different from the original. Multiple UDS positive for methadone without any reasonable explanation.  He has been offered nonnarcotic therapies only moving forward. Formatting of this note might be different from the original. Last Assessment & Plan:  Formatting of this note might be different from the original. Multiple UDS positive for me   Sinus bradycardia    SOB (shortness of breath)    SSS (sick sinus syndrome) ([place]) 11/23/2015   TOBACCO USE, QUIT 04/23/2010   Qualifier: Diagnosis of  By: Ronne Binning     Past Surgical History:  Procedure Laterality Date   CARDIAC CATHETERIZATION  06/2008   At [facility]. Mild LAD stenosis   CARDIAC CATHETERIZATION  05/2010   95% mid LAD stenosis, 20% RCA stenosis   CORONARY ANGIOPLASTY WITH STENT PLACEMENT  05/2010   Mid LAD: DES: 3.0 X23 mm Promus   HERNIA REPAIR     KNEE SURGERY     LEFT HEART CATHETERIZATION WITH CORONARY ANGIOGRAM N/A 05/15/2011   Procedure: LEFT HEART CATHETERIZATION WITH CORONARY ANGIOGRAM;  Surgeon: Wellington Hampshire, MD;  Location: [place] CATH LAB;  Service: Cardiovascular;  Laterality: N/A;   PERMANENT PACEMAKER INSERTION N/A 05/31/2011   Procedure: PERMANENT PACEMAKER INSERTION;  Surgeon: Evans Lance, MD;  Location: [facility] CATH LAB;  Service: Cardiovascular;  Laterality: N/A;   TONSILLECTOMY     VARICOSE VEIN SURGERY       Current Facility-Administered Medications  Medication Dose Route Frequency Provider Last Rate Last Admin   0.9 %  sodium chloride infusion   Intravenous Continuous Yuka Lallier, Ocie Doyne, MD 50 mL/hr at 01/17/21 1248 New Bag at 01/17/21 1248   chlorhexidine (HIBICLENS) 4 % liquid 4 application  4 application Topical Once Vincenza Dail Hassell Done, MD       gentamicin (GARAMYCIN) 80 mg in sodium chloride 0.9 % 500 mL irrigation  80 mg Irrigation On Call Sintia Mckissic,  Clydie Dillen Hassell Done, MD       vancomycin (VANCOREADY) IVPB 1500 mg/300 mL  1,500 mg Intravenous On Call Constance Haw, MD        Allergies:   Ibuprofen, Codeine, and Penicillins   Social History:  The patient  reports that he has been smoking cigarettes and cigars. He has a 50.00 pack-year smoking history. He has never used smokeless tobacco. He reports that he does not drink alcohol and does not use drugs.   Family History:  The patient's family history includes Bone cancer in his father; Brain cancer in his father; Heart failure (age of onset: 53) in his father; Lung cancer in his father; Prostate cancer in his paternal uncle.   ROS:  Please see the history of present illness.   Otherwise, review of systems is positive for none.   All other systems are reviewed and negative.   PHYSICAL EXAM: VS:  BP 128/79   Pulse Marland Kitchen)  55   Temp 97.9 F (36.6 C) (Oral)   Ht 6\' 1"  (1.854 m)   Wt 99.8 kg   SpO2 96%   BMI 29.03 kg/m  , BMI Body mass index is 29.03 kg/m. GEN: Well nourished, well developed, in no acute distress  HEENT: normal  Neck: no JVD, carotid bruits, or masses Cardiac: RRR; no murmurs, rubs, or gallops,no edema  Respiratory:  clear to auscultation bilaterally, normal work of breathing GI: soft, nontender, nondistended, + BS MS: no deformity or atrophy  Skin: warm and dry Neuro:  Strength and sensation are intact Psych: euthymic mood, full affect  Recent Labs: No results found for requested labs within last 8760 hours.    Lipid Panel  No results found for: CHOL, TRIG, HDL, CHOLHDL, VLDL, LDLCALC, LDLDIRECT   Wt Readings from Last 3 Encounters:  01/17/21 99.8 kg  11/20/20 103.5 kg  09/14/20 100.7 kg      Other studies Reviewed: Additional studies/ records that were reviewed today include: TTE 09/14/20  Review of the above records today demonstrates:   1. Left ventricular ejection fraction, by estimation, is 50 to 55%. The  left ventricle has low normal function. The  left ventricle has no regional  wall motion abnormalities. There is mild left ventricular hypertrophy.  Left ventricular diastolic  parameters were normal.   2. Right ventricular systolic function is normal. The right ventricular  size is normal. There is normal pulmonary artery systolic pressure.   3. The mitral valve is normal in structure. Mild mitral valve  regurgitation. No evidence of mitral stenosis.   4. The aortic valve is normal in structure. Aortic valve regurgitation is  not visualized. No aortic stenosis is present.   5. The inferior vena cava is normal in size with greater than 50%  respiratory variability, suggesting right atrial pressure of 3 mmHg.    ASSESSMENT AND PLAN:  1.  Pacemaker:  Ryan Quinn has presented today for surgery, with the diagnosis of pacemaker ERI.  The various methods of treatment have been discussed with the patient and family. After consideration of risks, benefits and other options for treatment, the patient has consented to  Procedure(s): Pacemaker implant as a surgical intervention .  Risks include but not limited to bleeding, infection, pneumothorax, perforation, tamponade, vascular damage, renal failure, MI, stroke, death, and lead dislodgement . The patient's history has been reviewed, patient examined, no change in status, stable for surgery.  I have reviewed the patient's chart and labs.  Questions were answered to the patient's satisfaction.    Rosann Gorum Curt Bears, MD 01/17/2021 1:23 PM

## 2021-01-17 NOTE — Discharge Instructions (Signed)

## 2021-01-18 ENCOUNTER — Encounter (HOSPITAL_COMMUNITY): Payer: Self-pay | Admitting: Cardiology

## 2021-01-31 ENCOUNTER — Ambulatory Visit: Payer: Medicare Other

## 2021-02-06 ENCOUNTER — Emergency Department (HOSPITAL_COMMUNITY)
Admission: EM | Admit: 2021-02-06 | Discharge: 2021-02-07 | Disposition: A | Discharge status: Home / Self Care | Payer: Medicare Other | Attending: Emergency Medicine | Admitting: Emergency Medicine

## 2021-02-06 ENCOUNTER — Emergency Department (HOSPITAL_COMMUNITY): Payer: Medicare Other

## 2021-02-06 DIAGNOSIS — Z5321 Procedure and treatment not carried out due to patient leaving prior to being seen by health care provider: Secondary | ICD-10-CM | POA: Diagnosis not present

## 2021-02-06 DIAGNOSIS — Z950 Presence of cardiac pacemaker: Secondary | ICD-10-CM | POA: Diagnosis not present

## 2021-02-06 DIAGNOSIS — R079 Chest pain, unspecified: Secondary | ICD-10-CM | POA: Diagnosis not present

## 2021-02-06 LAB — CBC
HCT: 45.3 % (ref 39.0–52.0)
Hemoglobin: 15.4 g/dL (ref 13.0–17.0)
MCH: 31.8 pg (ref 26.0–34.0)
MCHC: 34 g/dL (ref 30.0–36.0)
MCV: 93.4 fL (ref 80.0–100.0)
Platelets: 168 10*3/uL (ref 150–400)
RBC: 4.85 MIL/uL (ref 4.22–5.81)
RDW: 12.9 % (ref 11.5–15.5)
WBC: 7.4 10*3/uL (ref 4.0–10.5)
nRBC: 0 % (ref 0.0–0.2)

## 2021-02-06 LAB — TROPONIN I (HIGH SENSITIVITY): Troponin I (High Sensitivity): 10 ng/L (ref <18)

## 2021-02-06 LAB — BASIC METABOLIC PANEL
Anion gap: 14 (ref 5–15)
BUN: 51 mg/dL — ABNORMAL HIGH (ref 6–20)
CO2: 25 mmol/L (ref 22–32)
Calcium: 9.5 mg/dL (ref 8.9–10.3)
Chloride: 97 mmol/L — ABNORMAL LOW (ref 98–111)
Creatinine, Ser: 2.88 mg/dL — ABNORMAL HIGH (ref 0.61–1.24)
GFR, Estimated: 24 mL/min — ABNORMAL LOW (ref >60)
Glucose, Bld: 97 mg/dL (ref 70–99)
Potassium: 3.4 mmol/L — ABNORMAL LOW (ref 3.5–5.1)
Sodium: 136 mmol/L (ref 135–145)

## 2021-02-06 NOTE — ED Provider Notes (Signed)
Emergency Medicine Provider Triage Evaluation Note  Ryan Quinn , a 60 y.o. male  was evaluated in triage.  Pt complains of chest pain and increased shortness of breath for the past 3 days.  Patient states that he recently had a generator and his pacemaker replaced about 2 weeks ago, and is post to have follow-up scheduled for tomorrow.  He states his pain is centralized in his chest and is intermittent.  He has been doing several nebulizer breathing treatments at home without relief for his shortness of breath.  History of COPD.  Review of Systems  Positive: Chest pain, shortness of breath Negative: Fever, chills, abdominal pain, nausea, vomiting  Physical Exam  BP (!) 141/95 (BP Location: Left Arm)   Pulse 86   Temp 98.6 F (37 C) (Oral)   Resp 16   SpO2 98%  Gen:   Awake, no distress   Resp:  Normal effort  MSK:   Moves extremities without difficulty  Other:  Diffuse expiratory wheezing in all fields  Medical Decision Making  Medically screening exam initiated at 6:13 PM.  Appropriate orders placed.  FODAY CONE was informed that the remainder of the evaluation will be completed by another provider, this initial triage assessment does not replace that evaluation, and the importance of remaining in the ED until their evaluation is complete.     Jeanella Flattery 02/06/21 1813    Maia Plan, MD 02/12/21 1031

## 2021-02-06 NOTE — ED Triage Notes (Signed)
Pt here via EMS with c/o chest pain and new onset of respiratory issues. Pt had generated replaced in pace maker X2 weeks and began having pain.   112/86 HR 90 96% RA  18 RAC

## 2021-02-07 ENCOUNTER — Ambulatory Visit: Payer: Medicare Other

## 2021-04-18 ENCOUNTER — Ambulatory Visit (INDEPENDENT_AMBULATORY_CARE_PROVIDER_SITE_OTHER): Payer: Medicare Other

## 2021-04-18 DIAGNOSIS — I495 Sick sinus syndrome: Secondary | ICD-10-CM

## 2021-04-18 LAB — CUP PACEART REMOTE DEVICE CHECK
Battery Remaining Longevity: 127 mo
Battery Remaining Percentage: 95.5 %
Battery Voltage: 3.01 V
Brady Statistic AP VP Percent: 1 %
Brady Statistic AP VS Percent: 58 %
Brady Statistic AS VP Percent: 1 %
Brady Statistic AS VS Percent: 41 %
Brady Statistic RA Percent Paced: 57 %
Brady Statistic RV Percent Paced: 1 %
Date Time Interrogation Session: 20230208020014
Implantable Lead Implant Date: 20130322
Implantable Lead Implant Date: 20130322
Implantable Lead Location: 753859
Implantable Lead Location: 753860
Implantable Pulse Generator Implant Date: 20221109
Lead Channel Impedance Value: 400 Ohm
Lead Channel Impedance Value: 400 Ohm
Lead Channel Pacing Threshold Amplitude: 0.625 V
Lead Channel Pacing Threshold Amplitude: 0.875 V
Lead Channel Pacing Threshold Pulse Width: 0.4 ms
Lead Channel Pacing Threshold Pulse Width: 0.4 ms
Lead Channel Sensing Intrinsic Amplitude: 12 mV
Lead Channel Sensing Intrinsic Amplitude: 5 mV
Lead Channel Setting Pacing Amplitude: 1.125
Lead Channel Setting Pacing Amplitude: 1.625
Lead Channel Setting Pacing Pulse Width: 0.4 ms
Lead Channel Setting Sensing Sensitivity: 2 mV
Pulse Gen Model: 2272
Pulse Gen Serial Number: 3967851

## 2021-04-23 NOTE — Progress Notes (Signed)
Remote pacemaker transmission.   

## 2021-05-07 ENCOUNTER — Encounter: Payer: Medicare Other | Admitting: Cardiology

## 2021-07-18 ENCOUNTER — Ambulatory Visit (INDEPENDENT_AMBULATORY_CARE_PROVIDER_SITE_OTHER): Payer: Medicare Other

## 2021-07-18 DIAGNOSIS — I495 Sick sinus syndrome: Secondary | ICD-10-CM | POA: Diagnosis not present

## 2021-07-19 LAB — CUP PACEART REMOTE DEVICE CHECK
Battery Remaining Longevity: 122 mo
Battery Remaining Percentage: 95.5 %
Battery Voltage: 2.99 V
Brady Statistic AP VP Percent: 1 %
Brady Statistic AP VS Percent: 61 %
Brady Statistic AS VP Percent: 1 %
Brady Statistic AS VS Percent: 38 %
Brady Statistic RA Percent Paced: 61 %
Brady Statistic RV Percent Paced: 1 %
Date Time Interrogation Session: 20230510020014
Implantable Lead Implant Date: 20130322
Implantable Lead Implant Date: 20130322
Implantable Lead Location: 753859
Implantable Lead Location: 753860
Implantable Pulse Generator Implant Date: 20221109
Lead Channel Impedance Value: 400 Ohm
Lead Channel Impedance Value: 400 Ohm
Lead Channel Pacing Threshold Amplitude: 0.5 V
Lead Channel Pacing Threshold Amplitude: 1 V
Lead Channel Pacing Threshold Pulse Width: 0.4 ms
Lead Channel Pacing Threshold Pulse Width: 0.4 ms
Lead Channel Sensing Intrinsic Amplitude: 10.5 mV
Lead Channel Sensing Intrinsic Amplitude: 4.3 mV
Lead Channel Setting Pacing Amplitude: 1.25 V
Lead Channel Setting Pacing Amplitude: 1.5 V
Lead Channel Setting Pacing Pulse Width: 0.4 ms
Lead Channel Setting Sensing Sensitivity: 2 mV
Pulse Gen Model: 2272
Pulse Gen Serial Number: 3967851

## 2021-07-30 NOTE — Progress Notes (Signed)
Remote pacemaker transmission.   

## 2021-08-13 ENCOUNTER — Encounter: Payer: Medicare Other | Admitting: Cardiology

## 2021-10-17 ENCOUNTER — Ambulatory Visit (INDEPENDENT_AMBULATORY_CARE_PROVIDER_SITE_OTHER): Payer: Medicare Other

## 2021-10-17 DIAGNOSIS — I495 Sick sinus syndrome: Secondary | ICD-10-CM | POA: Diagnosis not present

## 2021-10-17 LAB — CUP PACEART REMOTE DEVICE CHECK
Battery Remaining Longevity: 117 mo
Battery Remaining Percentage: 95.5 %
Battery Voltage: 2.99 V
Brady Statistic AP VP Percent: 1 %
Brady Statistic AP VS Percent: 60 %
Brady Statistic AS VP Percent: 1 %
Brady Statistic AS VS Percent: 39 %
Brady Statistic RA Percent Paced: 60 %
Brady Statistic RV Percent Paced: 1.2 %
Date Time Interrogation Session: 20230809033811
Implantable Lead Implant Date: 20130322
Implantable Lead Implant Date: 20130322
Implantable Lead Location: 753859
Implantable Lead Location: 753860
Implantable Pulse Generator Implant Date: 20221109
Lead Channel Impedance Value: 350 Ohm
Lead Channel Impedance Value: 400 Ohm
Lead Channel Pacing Threshold Amplitude: 0.375 V
Lead Channel Pacing Threshold Amplitude: 1.25 V
Lead Channel Pacing Threshold Pulse Width: 0.4 ms
Lead Channel Pacing Threshold Pulse Width: 0.4 ms
Lead Channel Sensing Intrinsic Amplitude: 10.5 mV
Lead Channel Sensing Intrinsic Amplitude: 4.5 mV
Lead Channel Setting Pacing Amplitude: 1.375
Lead Channel Setting Pacing Amplitude: 1.5 V
Lead Channel Setting Pacing Pulse Width: 0.4 ms
Lead Channel Setting Sensing Sensitivity: 2 mV
Pulse Gen Model: 2272
Pulse Gen Serial Number: 3967851

## 2021-11-19 ENCOUNTER — Ambulatory Visit: Payer: Medicare Other | Admitting: Cardiology

## 2021-11-20 NOTE — Progress Notes (Signed)
Remote pacemaker transmission.   

## 2022-01-16 ENCOUNTER — Ambulatory Visit (INDEPENDENT_AMBULATORY_CARE_PROVIDER_SITE_OTHER): Payer: Medicare Other

## 2022-01-16 DIAGNOSIS — I495 Sick sinus syndrome: Secondary | ICD-10-CM | POA: Diagnosis not present

## 2022-01-17 LAB — CUP PACEART REMOTE DEVICE CHECK
Battery Remaining Longevity: 115 mo
Battery Remaining Percentage: 94 %
Battery Voltage: 2.99 V
Brady Statistic AP VP Percent: 1 %
Brady Statistic AP VS Percent: 59 %
Brady Statistic AS VP Percent: 1 %
Brady Statistic AS VS Percent: 39 %
Brady Statistic RA Percent Paced: 59 %
Brady Statistic RV Percent Paced: 1.3 %
Date Time Interrogation Session: 20231108020013
Implantable Lead Connection Status: 753985
Implantable Lead Connection Status: 753985
Implantable Lead Implant Date: 20130322
Implantable Lead Implant Date: 20130322
Implantable Lead Location: 753859
Implantable Lead Location: 753860
Implantable Pulse Generator Implant Date: 20221109
Lead Channel Impedance Value: 350 Ohm
Lead Channel Impedance Value: 380 Ohm
Lead Channel Pacing Threshold Amplitude: 0.5 V
Lead Channel Pacing Threshold Amplitude: 1.125 V
Lead Channel Pacing Threshold Pulse Width: 0.4 ms
Lead Channel Pacing Threshold Pulse Width: 0.4 ms
Lead Channel Sensing Intrinsic Amplitude: 10.1 mV
Lead Channel Sensing Intrinsic Amplitude: 4 mV
Lead Channel Setting Pacing Amplitude: 1.375
Lead Channel Setting Pacing Amplitude: 1.5 V
Lead Channel Setting Pacing Pulse Width: 0.4 ms
Lead Channel Setting Sensing Sensitivity: 2 mV
Pulse Gen Model: 2272
Pulse Gen Serial Number: 3967851

## 2022-01-30 NOTE — Progress Notes (Signed)
Remote pacemaker transmission.   

## 2022-04-16 IMAGING — CR DG CHEST 2V
2 series · 2 of 2 positions shown · non-contrast
Comparison: None.

CLINICAL DATA: Chest pain and shortness of breath

EXAM:
CHEST - 2 VIEW

[chest pa]
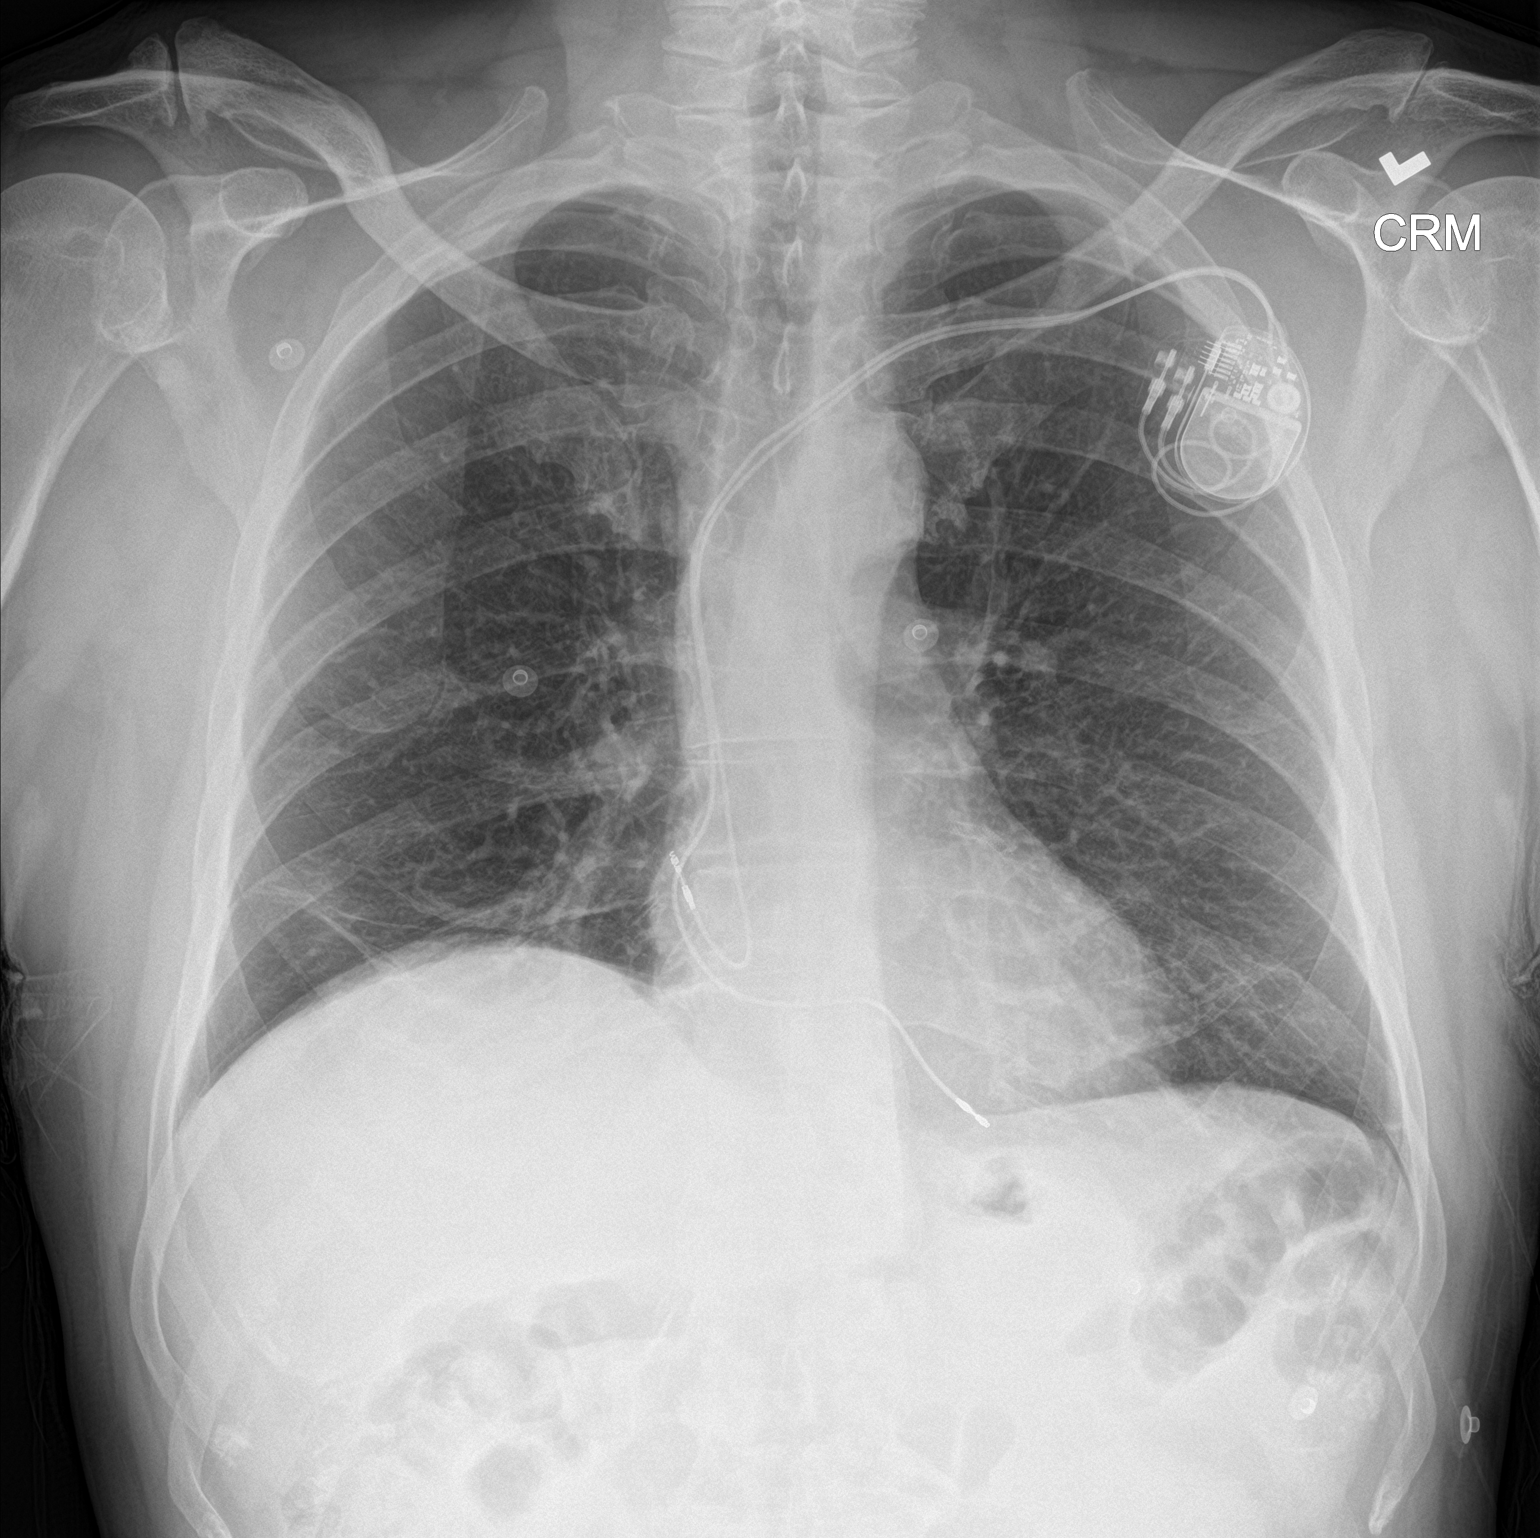

[chest lat]
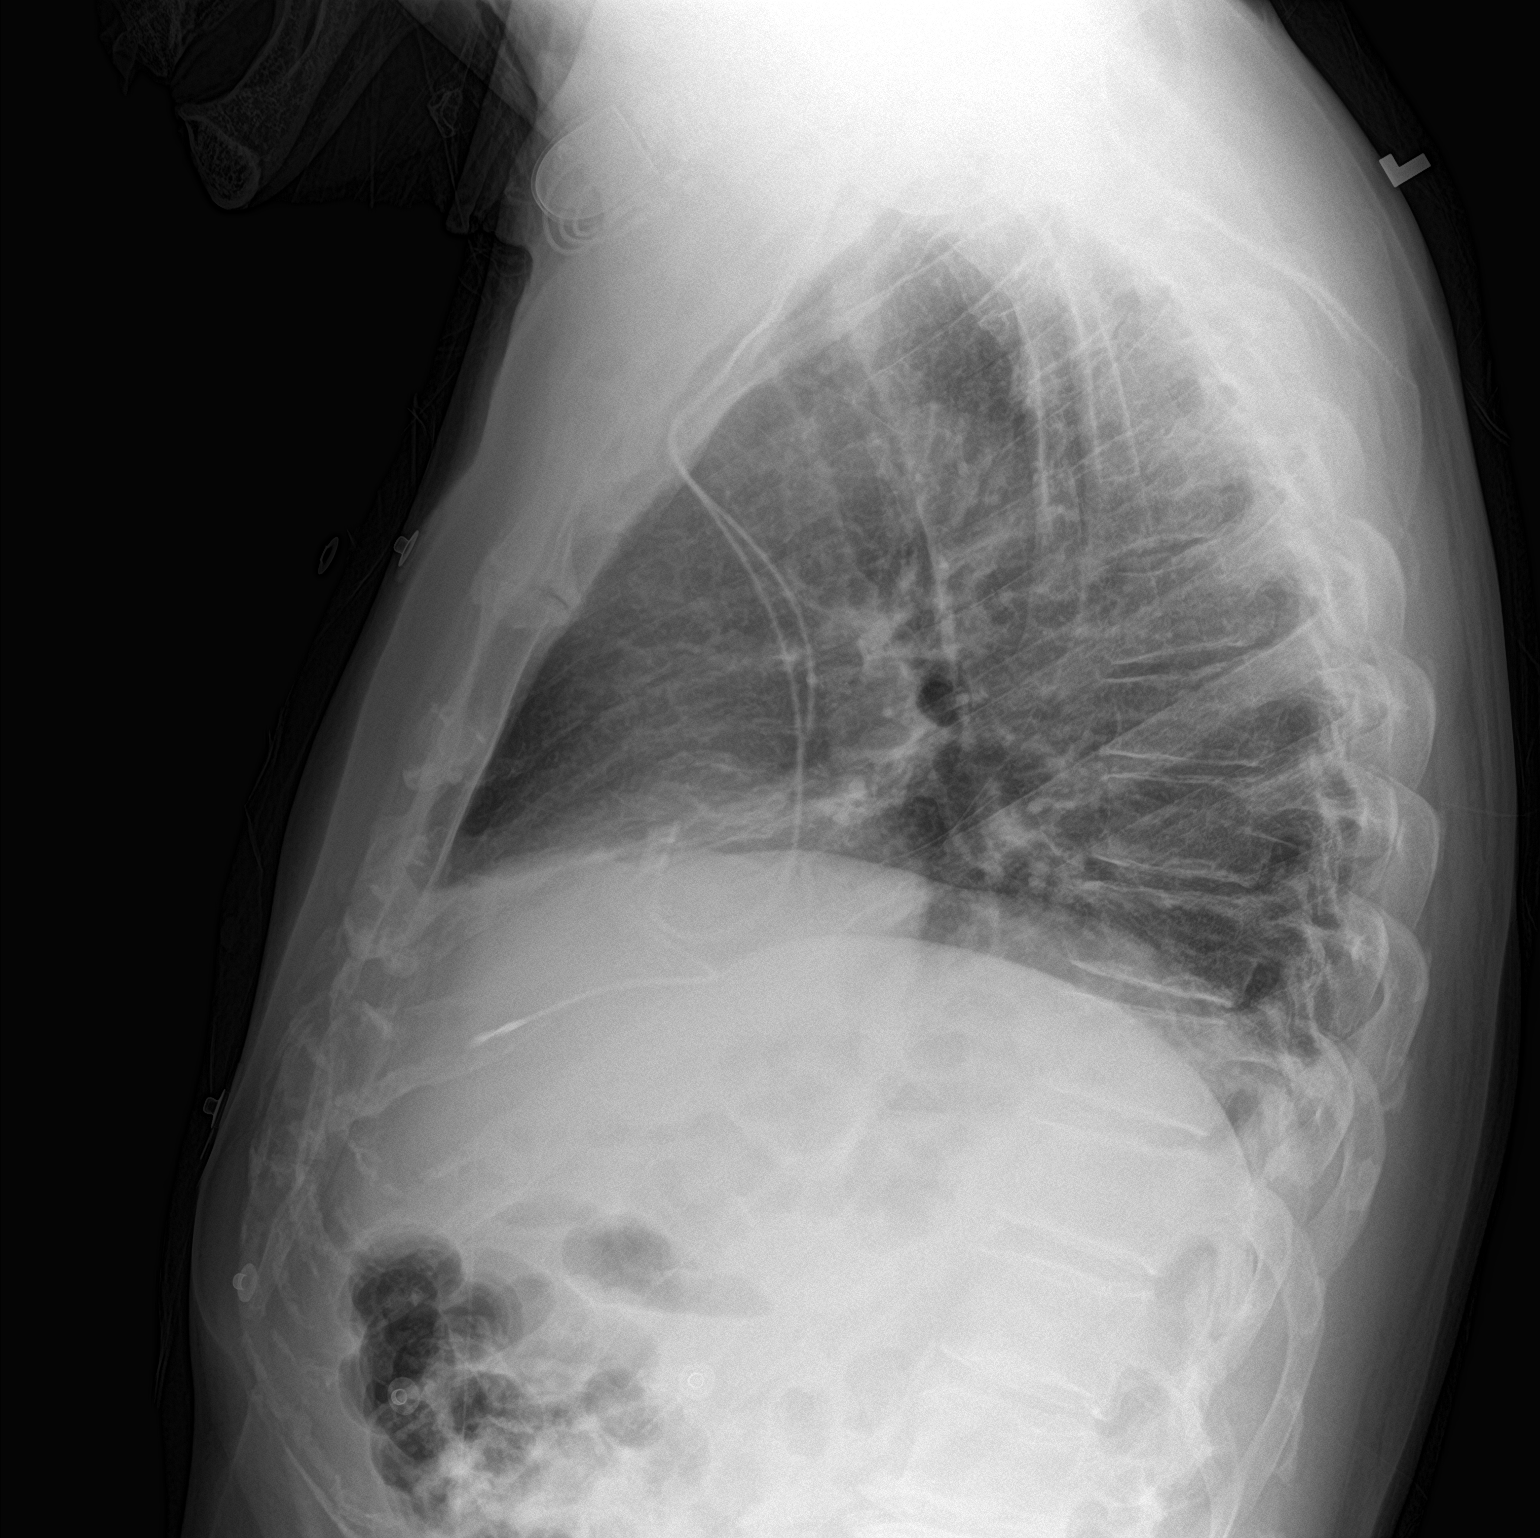

[2 of 2 positions shown; findings below may reference images not displayed]

FINDINGS: The heart size and mediastinal contours are within normal limits.
Both lungs are clear. The visualized skeletal structures are
unremarkable. Left chest wall pacemaker leads in expected position.
IMPRESSION: No active cardiopulmonary disease.

## 2022-04-17 ENCOUNTER — Ambulatory Visit (INDEPENDENT_AMBULATORY_CARE_PROVIDER_SITE_OTHER): Payer: Medicare Other

## 2022-04-17 DIAGNOSIS — I495 Sick sinus syndrome: Secondary | ICD-10-CM | POA: Diagnosis not present

## 2022-04-17 LAB — CUP PACEART REMOTE DEVICE CHECK
Battery Remaining Longevity: 113 mo
Battery Remaining Percentage: 92 %
Battery Voltage: 2.99 V
Brady Statistic AP VP Percent: 1 %
Brady Statistic AP VS Percent: 55 %
Brady Statistic AS VP Percent: 1 %
Brady Statistic AS VS Percent: 43 %
Brady Statistic RA Percent Paced: 55 %
Brady Statistic RV Percent Paced: 1.4 %
Date Time Interrogation Session: 20240207020013
Implantable Lead Connection Status: 753985
Implantable Lead Connection Status: 753985
Implantable Lead Implant Date: 20130322
Implantable Lead Implant Date: 20130322
Implantable Lead Location: 753859
Implantable Lead Location: 753860
Implantable Pulse Generator Implant Date: 20221109
Lead Channel Impedance Value: 380 Ohm
Lead Channel Impedance Value: 380 Ohm
Lead Channel Pacing Threshold Amplitude: 0.5 V
Lead Channel Pacing Threshold Amplitude: 1.125 V
Lead Channel Pacing Threshold Pulse Width: 0.4 ms
Lead Channel Pacing Threshold Pulse Width: 0.4 ms
Lead Channel Sensing Intrinsic Amplitude: 11 mV
Lead Channel Sensing Intrinsic Amplitude: 4.7 mV
Lead Channel Setting Pacing Amplitude: 1.375
Lead Channel Setting Pacing Amplitude: 1.5 V
Lead Channel Setting Pacing Pulse Width: 0.4 ms
Lead Channel Setting Sensing Sensitivity: 2 mV
Pulse Gen Model: 2272
Pulse Gen Serial Number: 3967851

## 2022-05-13 NOTE — Progress Notes (Signed)
Remote pacemaker transmission.   

## 2022-06-07 ENCOUNTER — Telehealth: Payer: Self-pay | Admitting: Cardiology

## 2022-06-07 NOTE — Telephone Encounter (Signed)
Transmission was not received, maybe needs help setting up his monitor.

## 2022-06-07 NOTE — Telephone Encounter (Signed)
Patient called stating he received his new heart monitor equipment and wants to check transmission is being received.

## 2022-06-12 NOTE — Telephone Encounter (Signed)
Pt is out of town and agreed to send a manual transmission tomorrow when he get back.

## 2022-06-14 NOTE — Telephone Encounter (Signed)
LMOVM for patient to give [place] a call.

## 2022-06-14 NOTE — Telephone Encounter (Signed)
Transmission received 06/14/2022. Monitor is working fine.

## 2022-07-17 ENCOUNTER — Ambulatory Visit (INDEPENDENT_AMBULATORY_CARE_PROVIDER_SITE_OTHER): Payer: Medicare Other

## 2022-07-17 DIAGNOSIS — I495 Sick sinus syndrome: Secondary | ICD-10-CM

## 2022-07-18 LAB — CUP PACEART REMOTE DEVICE CHECK
Battery Remaining Longevity: 109 mo
Battery Remaining Percentage: 89 %
Battery Voltage: 2.99 V
Brady Statistic AP VP Percent: 1 %
Brady Statistic AP VS Percent: 53 %
Brady Statistic AS VP Percent: 1 %
Brady Statistic AS VS Percent: 45 %
Brady Statistic RA Percent Paced: 53 %
Brady Statistic RV Percent Paced: 1.5 %
Date Time Interrogation Session: 20240508020034
Implantable Lead Connection Status: 753985
Implantable Lead Connection Status: 753985
Implantable Lead Implant Date: 20130322
Implantable Lead Implant Date: 20130322
Implantable Lead Location: 753859
Implantable Lead Location: 753860
Implantable Pulse Generator Implant Date: 20221109
Lead Channel Impedance Value: 380 Ohm
Lead Channel Impedance Value: 430 Ohm
Lead Channel Pacing Threshold Amplitude: 0.5 V
Lead Channel Pacing Threshold Amplitude: 1.375 V
Lead Channel Pacing Threshold Pulse Width: 0.4 ms
Lead Channel Pacing Threshold Pulse Width: 0.4 ms
Lead Channel Sensing Intrinsic Amplitude: 12 mV
Lead Channel Sensing Intrinsic Amplitude: 4.6 mV
Lead Channel Setting Pacing Amplitude: 1.5 V
Lead Channel Setting Pacing Amplitude: 1.625
Lead Channel Setting Pacing Pulse Width: 0.4 ms
Lead Channel Setting Sensing Sensitivity: 2 mV
Pulse Gen Model: 2272
Pulse Gen Serial Number: 3967851

## 2022-08-07 NOTE — Progress Notes (Signed)
Remote pacemaker transmission.   

## 2022-10-16 ENCOUNTER — Ambulatory Visit: Payer: Medicare Other

## 2022-10-16 DIAGNOSIS — I495 Sick sinus syndrome: Secondary | ICD-10-CM | POA: Diagnosis not present

## 2022-11-01 NOTE — Progress Notes (Signed)
Remote pacemaker transmission.   

## 2023-01-15 ENCOUNTER — Ambulatory Visit (INDEPENDENT_AMBULATORY_CARE_PROVIDER_SITE_OTHER): Payer: 59

## 2023-01-15 DIAGNOSIS — I495 Sick sinus syndrome: Secondary | ICD-10-CM | POA: Diagnosis not present

## 2023-01-15 LAB — CUP PACEART REMOTE DEVICE CHECK
Battery Remaining Longevity: 105 mo
Battery Remaining Percentage: 85 %
Battery Voltage: 2.99 V
Brady Statistic AP VP Percent: 1 %
Brady Statistic AP VS Percent: 51 %
Brady Statistic AS VP Percent: 1 %
Brady Statistic AS VS Percent: 47 %
Brady Statistic RA Percent Paced: 51 %
Brady Statistic RV Percent Paced: 1.7 %
Date Time Interrogation Session: 20241106020015
Implantable Lead Connection Status: 753985
Implantable Lead Connection Status: 753985
Implantable Lead Implant Date: 20130322
Implantable Lead Implant Date: 20130322
Implantable Lead Location: 753859
Implantable Lead Location: 753860
Implantable Pulse Generator Implant Date: 20221109
Lead Channel Impedance Value: 350 Ohm
Lead Channel Impedance Value: 400 Ohm
Lead Channel Pacing Threshold Amplitude: 0.5 V
Lead Channel Pacing Threshold Amplitude: 1.25 V
Lead Channel Pacing Threshold Pulse Width: 0.4 ms
Lead Channel Pacing Threshold Pulse Width: 0.4 ms
Lead Channel Sensing Intrinsic Amplitude: 12 mV
Lead Channel Sensing Intrinsic Amplitude: 5 mV
Lead Channel Setting Pacing Amplitude: 1.5 V
Lead Channel Setting Pacing Amplitude: 1.5 V
Lead Channel Setting Pacing Pulse Width: 0.4 ms
Lead Channel Setting Sensing Sensitivity: 2 mV
Pulse Gen Model: 2272
Pulse Gen Serial Number: 3967851

## 2023-02-04 NOTE — Progress Notes (Signed)
Remote pacemaker transmission.   

## 2023-04-16 ENCOUNTER — Ambulatory Visit (INDEPENDENT_AMBULATORY_CARE_PROVIDER_SITE_OTHER): Payer: 59

## 2023-04-16 DIAGNOSIS — I495 Sick sinus syndrome: Secondary | ICD-10-CM | POA: Diagnosis not present

## 2023-04-16 LAB — CUP PACEART REMOTE DEVICE CHECK
Battery Remaining Longevity: 103 mo
Battery Remaining Percentage: 82 %
Battery Voltage: 2.99 V
Brady Statistic AP VP Percent: 1 %
Brady Statistic AP VS Percent: 50 %
Brady Statistic AS VP Percent: 1 %
Brady Statistic AS VS Percent: 49 %
Brady Statistic RA Percent Paced: 50 %
Brady Statistic RV Percent Paced: 1.7 %
Date Time Interrogation Session: 20250205023108
Implantable Lead Connection Status: 753985
Implantable Lead Connection Status: 753985
Implantable Lead Implant Date: 20130322
Implantable Lead Implant Date: 20130322
Implantable Lead Location: 753859
Implantable Lead Location: 753860
Implantable Pulse Generator Implant Date: 20221109
Lead Channel Impedance Value: 380 Ohm
Lead Channel Impedance Value: 430 Ohm
Lead Channel Pacing Threshold Amplitude: 0.625 V
Lead Channel Pacing Threshold Amplitude: 0.75 V
Lead Channel Pacing Threshold Pulse Width: 0.4 ms
Lead Channel Pacing Threshold Pulse Width: 0.4 ms
Lead Channel Sensing Intrinsic Amplitude: 12 mV
Lead Channel Sensing Intrinsic Amplitude: 4.9 mV
Lead Channel Setting Pacing Amplitude: 1 V
Lead Channel Setting Pacing Amplitude: 1.625
Lead Channel Setting Pacing Pulse Width: 0.4 ms
Lead Channel Setting Sensing Sensitivity: 2 mV
Pulse Gen Model: 2272
Pulse Gen Serial Number: 3967851

## 2023-05-23 NOTE — Progress Notes (Signed)
 Remote pacemaker transmission.

## 2023-07-16 ENCOUNTER — Ambulatory Visit (INDEPENDENT_AMBULATORY_CARE_PROVIDER_SITE_OTHER): Payer: Medicare Other

## 2023-07-16 DIAGNOSIS — I495 Sick sinus syndrome: Secondary | ICD-10-CM

## 2023-07-16 LAB — CUP PACEART REMOTE DEVICE CHECK
Battery Remaining Longevity: 99 mo
Battery Remaining Percentage: 80 %
Battery Voltage: 2.98 V
Brady Statistic AP VP Percent: 1.1 %
Brady Statistic AP VS Percent: 49 %
Brady Statistic AS VP Percent: 1 %
Brady Statistic AS VS Percent: 49 %
Brady Statistic RA Percent Paced: 50 %
Brady Statistic RV Percent Paced: 1.8 %
Date Time Interrogation Session: 20250507020015
Implantable Lead Connection Status: 753985
Implantable Lead Connection Status: 753985
Implantable Lead Implant Date: 20130322
Implantable Lead Implant Date: 20130322
Implantable Lead Location: 753859
Implantable Lead Location: 753860
Implantable Pulse Generator Implant Date: 20221109
Lead Channel Impedance Value: 350 Ohm
Lead Channel Impedance Value: 400 Ohm
Lead Channel Pacing Threshold Amplitude: 0.5 V
Lead Channel Pacing Threshold Amplitude: 0.875 V
Lead Channel Pacing Threshold Pulse Width: 0.4 ms
Lead Channel Pacing Threshold Pulse Width: 0.4 ms
Lead Channel Sensing Intrinsic Amplitude: 12 mV
Lead Channel Sensing Intrinsic Amplitude: 4.5 mV
Lead Channel Setting Pacing Amplitude: 1.125
Lead Channel Setting Pacing Amplitude: 1.5 V
Lead Channel Setting Pacing Pulse Width: 0.4 ms
Lead Channel Setting Sensing Sensitivity: 2 mV
Pulse Gen Model: 2272
Pulse Gen Serial Number: 3967851

## 2023-07-27 NOTE — Progress Notes (Deleted)
  Electrophysiology Office Note:   Date:  07/27/2023  ID:  CURLIE MACKEN, DOB 1960-10-19, MRN [id number]  Primary Cardiologist: None Primary Heart Failure: None Electrophysiologist: Teegan Brandis Cortland Ding, MD  {Click to update primary MD,subspecialty MD or APP then REFRESH:1}    History of Present Illness:   Ryan Quinn is a 63 y.o. male with h/o coronary artery disease post LAD stent, COPD, hypertension, hyperlipidemia seen today for routine electrophysiology followup.   Since last being seen in our clinic the patient reports doing ***.  he denies chest pain, palpitations, dyspnea, PND, orthopnea, nausea, vomiting, dizziness, syncope, edema, weight gain, or early satiety.   Review of systems complete and found to be negative unless listed in HPI.      EP Information / Studies Reviewed:    {EKGtoday:28818}      PPM Interrogation-  reviewed in detail today,  See PACEART report.  Device History: Abbott Dual Chamber PPM implanted 2013 for Symptomatic bradycardia  Risk Assessment/Calculations:             Physical Exam:   VS:  There were no vitals taken for this visit.   Wt Readings from Last 3 Encounters:  01/17/21 220 lb (99.8 kg)  11/20/20 228 lb 3.2 oz (103.5 kg)  09/14/20 222 lb (100.7 kg)     GEN: Well nourished, well developed in no acute distress NECK: No JVD; No carotid bruits CARDIAC: {EPRHYTHM:28826}, no murmurs, rubs, gallops RESPIRATORY:  Clear to auscultation without rales, wheezing or rhonchi  ABDOMEN: Soft, non-tender, non-distended EXTREMITIES:  No edema; No deformity   ASSESSMENT AND PLAN:    Symptomatic bradycardia s/p Abbott PPM  Normal PPM function Sensing, threshold, impedance within normal limits Device programming reviewed and appropriate for patient See Valeta Gaudier Art report No changes today  2.  Coronary artery disease: Post LAD stent.  No current chest pain  3.  Hypertension:***  Disposition:   Follow up with {EPPROVIDERS:28135} {EPFOLLOW  NF:62130}  Signed, Gisela Lea Cortland Ding, MD

## 2023-07-28 ENCOUNTER — Ambulatory Visit: Admitting: Cardiology

## 2023-08-11 ENCOUNTER — Encounter: Admitting: Cardiology

## 2023-08-23 NOTE — Progress Notes (Unsigned)
  Electrophysiology Office Note:   Date:  08/25/2023  ID:  JACQUEL MCCAMISH, DOB [birth year], MRN [id number]  Primary Cardiologist: None Primary Heart Failure: None Electrophysiologist: Javoris Star Cortland Ding, MD      History of Present Illness:   CAVON NICOLLS is a 63 y.o. male with h/o coronary artery disease, COPD, hypertension, hyperlipidemia seen today for routine electrophysiology followup.   Since last being seen in our clinic the patient reports doing well.  He has no chest pain or shortness of breath.  He is able to all of his daily activities.  He has no acute complaints at this time.  He is unaware of any ongoing issues.  he denies chest pain, palpitations, dyspnea, PND, orthopnea, nausea, vomiting, dizziness, syncope, edema, weight gain, or early satiety.   Review of systems complete and found to be negative unless listed in HPI.      EP Information / Studies Reviewed:    EKG is ordered today. Personal review as below.  EKG Interpretation Date/Time:  Monday August 25 2023 10:46:16 EDT Ventricular Rate:  69 PR Interval:  212 QRS Duration:  88 QT Interval:  388 QTC Calculation: 415 R Axis:   2  Text Interpretation: Atrial-paced rhythm with prolonged AV conduction Abnormal ECG When compared with ECG of 06-Feb-2021 18:07, Electronic atrial pacemaker has replaced Sinus rhythm Confirmed by Quierra Silverio ([id number]) on 08/25/2023 10:57:02 AM   PPM Interrogation-  reviewed in detail today,  See PACEART report.  Device History: Abbott Dual Chamber PPM implanted 05/31/2011 for Symptomatic bradycardia  Risk Assessment/Calculations:           Physical Exam:   VS:  BP 124/72   Pulse 69   Ht 6' 1 (1.854 m)   Wt 209 lb 12.8 oz (95.2 kg)   SpO2 93%   BMI 27.68 kg/m    Wt Readings from Last 3 Encounters:  08/25/23 209 lb 12.8 oz (95.2 kg)  01/17/21 220 lb (99.8 kg)  11/20/20 228 lb 3.2 oz (103.5 kg)     GEN: Well nourished, well developed in no acute distress NECK: No JVD; No  carotid bruits CARDIAC: Regular rate and rhythm, no murmurs, rubs, gallops RESPIRATORY:  Clear to auscultation without rales, wheezing or rhonchi  ABDOMEN: Soft, non-tender, non-distended EXTREMITIES:  No edema; No deformity   ASSESSMENT AND PLAN:    SND s/p Abbott PPM  Normal PPM function See Pace Art report Sensing, threshold, impedance within normal limits Programming appropriate Frequent noise episodes seen as AMS.  Sensitivity adjusted.  Basilia Stuckert continue to monitor on remote monitoring.  2.  Coronary artery disease: Post LAD stent.  No current chest pain.  Plan per primary cardiology.  3.  Hypertension: Well-controlled  Disposition:   Follow up with Dr. Lawana Pray 24 months   Signed, Travaris Kosh Cortland Ding, MD //

## 2023-08-25 ENCOUNTER — Ambulatory Visit: Attending: Cardiology | Admitting: Cardiology

## 2023-08-25 ENCOUNTER — Encounter: Payer: Self-pay | Admitting: Cardiology

## 2023-08-25 VITALS — BP 124/72 | HR 69 | Ht 73.0 in | Wt 209.8 lb

## 2023-08-25 DIAGNOSIS — I10 Essential (primary) hypertension: Secondary | ICD-10-CM | POA: Diagnosis not present

## 2023-08-25 DIAGNOSIS — I495 Sick sinus syndrome: Secondary | ICD-10-CM

## 2023-08-25 DIAGNOSIS — I2510 Atherosclerotic heart disease of native coronary artery without angina pectoris: Secondary | ICD-10-CM

## 2023-08-25 LAB — CUP PACEART INCLINIC DEVICE CHECK
Battery Remaining Longevity: 100 mo
Battery Voltage: 2.99 V
Brady Statistic RA Percent Paced: 50 %
Brady Statistic RV Percent Paced: 1.8 %
Date Time Interrogation Session: 20250616134555
Implantable Lead Connection Status: 753985
Implantable Lead Connection Status: 753985
Implantable Lead Implant Date: 20130322
Implantable Lead Implant Date: 20130322
Implantable Lead Location: 753859
Implantable Lead Location: 753860
Implantable Pulse Generator Implant Date: 20221109
Lead Channel Impedance Value: 350 Ohm
Lead Channel Impedance Value: 375 Ohm
Lead Channel Pacing Threshold Amplitude: 0.5 V
Lead Channel Pacing Threshold Amplitude: 0.5 V
Lead Channel Pacing Threshold Amplitude: 0.75 V
Lead Channel Pacing Threshold Amplitude: 0.75 V
Lead Channel Pacing Threshold Pulse Width: 0.4 ms
Lead Channel Pacing Threshold Pulse Width: 0.4 ms
Lead Channel Pacing Threshold Pulse Width: 0.4 ms
Lead Channel Pacing Threshold Pulse Width: 0.4 ms
Lead Channel Sensing Intrinsic Amplitude: 11.4 mV
Lead Channel Sensing Intrinsic Amplitude: 5 mV
Lead Channel Setting Pacing Amplitude: 1 V
Lead Channel Setting Pacing Amplitude: 1.5 V
Lead Channel Setting Pacing Pulse Width: 0.4 ms
Lead Channel Setting Sensing Sensitivity: 2 mV
Pulse Gen Model: 2272
Pulse Gen Serial Number: 3967851

## 2023-08-25 NOTE — Patient Instructions (Signed)
 Medication Instructions:  Your physician recommends that you continue on your current medications as directed. Please refer to the Current Medication list given to you today.  *If you need a refill on your cardiac medications before your next appointment, please call your pharmacy*  Lab Work: None ordered  Testing/Procedures: None ordered  Follow-Up: At [facility], you and your health needs are our priority.  As part of our continuing mission to provide you with exceptional heart care, our providers are all part of one team.  This team includes your primary Cardiologist (physician) and Advanced Practice Providers or APPs (Physician Assistants and Nurse Practitioners) who all work together to provide you with the care you need, when you need it.  Your next appointment:   2 year(s)  Provider:   Agatha Horsfall, MD      Thank you for choosing [facility]!!   Reece Cane, RN [phone]

## 2023-08-26 NOTE — Progress Notes (Signed)
 Remote pacemaker transmission.

## 2023-08-28 ENCOUNTER — Ambulatory Visit: Payer: Self-pay | Admitting: Cardiology

## 2023-10-15 ENCOUNTER — Ambulatory Visit (INDEPENDENT_AMBULATORY_CARE_PROVIDER_SITE_OTHER): Payer: Medicare Other

## 2023-10-15 DIAGNOSIS — I495 Sick sinus syndrome: Secondary | ICD-10-CM | POA: Diagnosis not present

## 2023-10-15 LAB — CUP PACEART REMOTE DEVICE CHECK
Battery Remaining Longevity: 95 mo
Battery Remaining Percentage: 77 %
Battery Voltage: 2.99 V
Brady Statistic AP VP Percent: 1.1 %
Brady Statistic AP VS Percent: 50 %
Brady Statistic AS VP Percent: 1 %
Brady Statistic AS VS Percent: 49 %
Brady Statistic RA Percent Paced: 50 %
Brady Statistic RV Percent Paced: 1.3 %
Date Time Interrogation Session: 20250806020019
Implantable Lead Connection Status: 753985
Implantable Lead Connection Status: 753985
Implantable Lead Implant Date: 20130322
Implantable Lead Implant Date: 20130322
Implantable Lead Location: 753859
Implantable Lead Location: 753860
Implantable Pulse Generator Implant Date: 20221109
Lead Channel Impedance Value: 350 Ohm
Lead Channel Impedance Value: 350 Ohm
Lead Channel Pacing Threshold Amplitude: 0.5 V
Lead Channel Pacing Threshold Amplitude: 1.125 V
Lead Channel Pacing Threshold Pulse Width: 0.4 ms
Lead Channel Pacing Threshold Pulse Width: 0.4 ms
Lead Channel Sensing Intrinsic Amplitude: 10.3 mV
Lead Channel Sensing Intrinsic Amplitude: 4.3 mV
Lead Channel Setting Pacing Amplitude: 1.375
Lead Channel Setting Pacing Amplitude: 1.5 V
Lead Channel Setting Pacing Pulse Width: 0.4 ms
Lead Channel Setting Sensing Sensitivity: 2 mV
Pulse Gen Model: 2272
Pulse Gen Serial Number: 3967851

## 2023-10-20 ENCOUNTER — Ambulatory Visit: Payer: Self-pay | Admitting: Cardiology

## 2023-12-01 ENCOUNTER — Encounter: Admitting: Cardiology

## 2023-12-08 NOTE — Progress Notes (Signed)
 Remote PPM Transmission

## 2024-01-14 ENCOUNTER — Ambulatory Visit

## 2024-01-14 DIAGNOSIS — I495 Sick sinus syndrome: Secondary | ICD-10-CM

## 2024-01-20 ENCOUNTER — Ambulatory Visit: Payer: Self-pay | Admitting: Cardiology

## 2024-01-20 LAB — CUP PACEART REMOTE DEVICE CHECK
Battery Remaining Longevity: 92 mo
Battery Remaining Percentage: 75 %
Battery Voltage: 2.99 V
Brady Statistic AP VP Percent: 1.2 %
Brady Statistic AP VS Percent: 54 %
Brady Statistic AS VP Percent: 1 %
Brady Statistic AS VS Percent: 44 %
Brady Statistic RA Percent Paced: 54 %
Brady Statistic RV Percent Paced: 1.3 %
Date Time Interrogation Session: 20251105024711
Implantable Lead Connection Status: 753985
Implantable Lead Connection Status: 753985
Implantable Lead Implant Date: 20130322
Implantable Lead Implant Date: 20130322
Implantable Lead Location: 753859
Implantable Lead Location: 753860
Implantable Pulse Generator Implant Date: 20221109
Lead Channel Impedance Value: 350 Ohm
Lead Channel Impedance Value: 380 Ohm
Lead Channel Pacing Threshold Amplitude: 0.5 V
Lead Channel Pacing Threshold Amplitude: 1.125 V
Lead Channel Pacing Threshold Pulse Width: 0.4 ms
Lead Channel Pacing Threshold Pulse Width: 0.4 ms
Lead Channel Sensing Intrinsic Amplitude: 10.2 mV
Lead Channel Sensing Intrinsic Amplitude: 4.2 mV
Lead Channel Setting Pacing Amplitude: 1.375
Lead Channel Setting Pacing Amplitude: 1.5 V
Lead Channel Setting Pacing Pulse Width: 0.4 ms
Lead Channel Setting Sensing Sensitivity: 2 mV
Pulse Gen Model: 2272
Pulse Gen Serial Number: 3967851

## 2024-01-21 NOTE — Progress Notes (Signed)
 Remote PPM Transmission

## 2024-04-14 ENCOUNTER — Ambulatory Visit

## 2024-04-15 LAB — CUP PACEART REMOTE DEVICE CHECK
Battery Remaining Longevity: 89 mo
Battery Remaining Percentage: 73 %
Battery Voltage: 2.99 V
Brady Statistic AP VP Percent: 1.4 %
Brady Statistic AP VS Percent: 60 %
Brady Statistic AS VP Percent: 1 %
Brady Statistic AS VS Percent: 39 %
Brady Statistic RA Percent Paced: 60 %
Brady Statistic RV Percent Paced: 1.5 %
Date Time Interrogation Session: 20260204020014
Implantable Lead Connection Status: 753985
Implantable Lead Connection Status: 753985
Implantable Lead Implant Date: 20130322
Implantable Lead Implant Date: 20130322
Implantable Lead Location: 753859
Implantable Lead Location: 753860
Implantable Pulse Generator Implant Date: 20221109
Lead Channel Impedance Value: 350 Ohm
Lead Channel Impedance Value: 400 Ohm
Lead Channel Pacing Threshold Amplitude: 0.5 V
Lead Channel Pacing Threshold Amplitude: 1.125 V
Lead Channel Pacing Threshold Pulse Width: 0.4 ms
Lead Channel Pacing Threshold Pulse Width: 0.4 ms
Lead Channel Sensing Intrinsic Amplitude: 10.8 mV
Lead Channel Sensing Intrinsic Amplitude: 4.1 mV
Lead Channel Setting Pacing Amplitude: 1.375
Lead Channel Setting Pacing Amplitude: 1.5 V
Lead Channel Setting Pacing Pulse Width: 0.4 ms
Lead Channel Setting Sensing Sensitivity: 2 mV
Pulse Gen Model: 2272
Pulse Gen Serial Number: 3967851

## 2024-07-14 ENCOUNTER — Encounter

## 2024-10-13 ENCOUNTER — Encounter

## 2025-01-12 ENCOUNTER — Encounter
